# Patient Record
Sex: Male | Born: 1965 | Race: Black or African American | Hispanic: No | Marital: Single | Smoking: Former smoker
Health system: Southern US, Community
[De-identification: ages and names within clinical notes are randomized; demographics above are authoritative.]

## PROBLEM LIST (undated history)

## (undated) DIAGNOSIS — I10 Essential (primary) hypertension: Secondary | ICD-10-CM

## (undated) HISTORY — PX: LIPOMA EXCISION: SHX5283

## (undated) HISTORY — PX: APPENDECTOMY: SHX54

## (undated) HISTORY — PX: CATARACT EXTRACTION: SUR2

## (undated) HISTORY — DX: Essential (primary) hypertension: I10

---

## 1999-03-28 ENCOUNTER — Encounter: Payer: Self-pay | Admitting: Emergency Medicine

## 1999-03-28 ENCOUNTER — Emergency Department (HOSPITAL_COMMUNITY): Admission: EM | Admit: 1999-03-28 | Discharge: 1999-03-28 | Payer: Self-pay | Admitting: Emergency Medicine

## 2000-11-28 ENCOUNTER — Emergency Department (HOSPITAL_COMMUNITY): Admission: EM | Admit: 2000-11-28 | Discharge: 2000-11-28 | Payer: Self-pay | Admitting: Internal Medicine

## 2003-03-08 ENCOUNTER — Emergency Department (HOSPITAL_COMMUNITY): Admission: AD | Admit: 2003-03-08 | Discharge: 2003-03-08 | Payer: Self-pay

## 2003-07-06 ENCOUNTER — Emergency Department (HOSPITAL_COMMUNITY): Admission: EM | Admit: 2003-07-06 | Discharge: 2003-07-06 | Payer: Self-pay

## 2004-05-18 ENCOUNTER — Emergency Department (HOSPITAL_COMMUNITY): Admission: EM | Admit: 2004-05-18 | Discharge: 2004-05-19 | Payer: Self-pay | Admitting: Emergency Medicine

## 2004-09-28 ENCOUNTER — Emergency Department (HOSPITAL_COMMUNITY): Admission: EM | Admit: 2004-09-28 | Discharge: 2004-09-28 | Payer: Self-pay | Admitting: Emergency Medicine

## 2007-09-17 ENCOUNTER — Emergency Department (HOSPITAL_COMMUNITY): Admission: EM | Admit: 2007-09-17 | Discharge: 2007-09-17 | Payer: Self-pay | Admitting: Emergency Medicine

## 2007-09-26 ENCOUNTER — Emergency Department (HOSPITAL_COMMUNITY): Admission: EM | Admit: 2007-09-26 | Discharge: 2007-09-26 | Payer: Self-pay | Admitting: Emergency Medicine

## 2007-10-21 ENCOUNTER — Emergency Department (HOSPITAL_COMMUNITY): Admission: EM | Admit: 2007-10-21 | Discharge: 2007-10-21 | Payer: Self-pay | Admitting: Family Medicine

## 2008-04-02 ENCOUNTER — Emergency Department (HOSPITAL_COMMUNITY): Admission: EM | Admit: 2008-04-02 | Discharge: 2008-04-02 | Payer: Self-pay | Admitting: Family Medicine

## 2008-12-21 IMAGING — CR DG CHEST 2V
2 series · 2 of 2 positions shown · non-contrast
Comparison: 05/19/2004

CLINICAL DATA: Chest pain, cough

CHEST - 2 VIEW

[w chest pa]
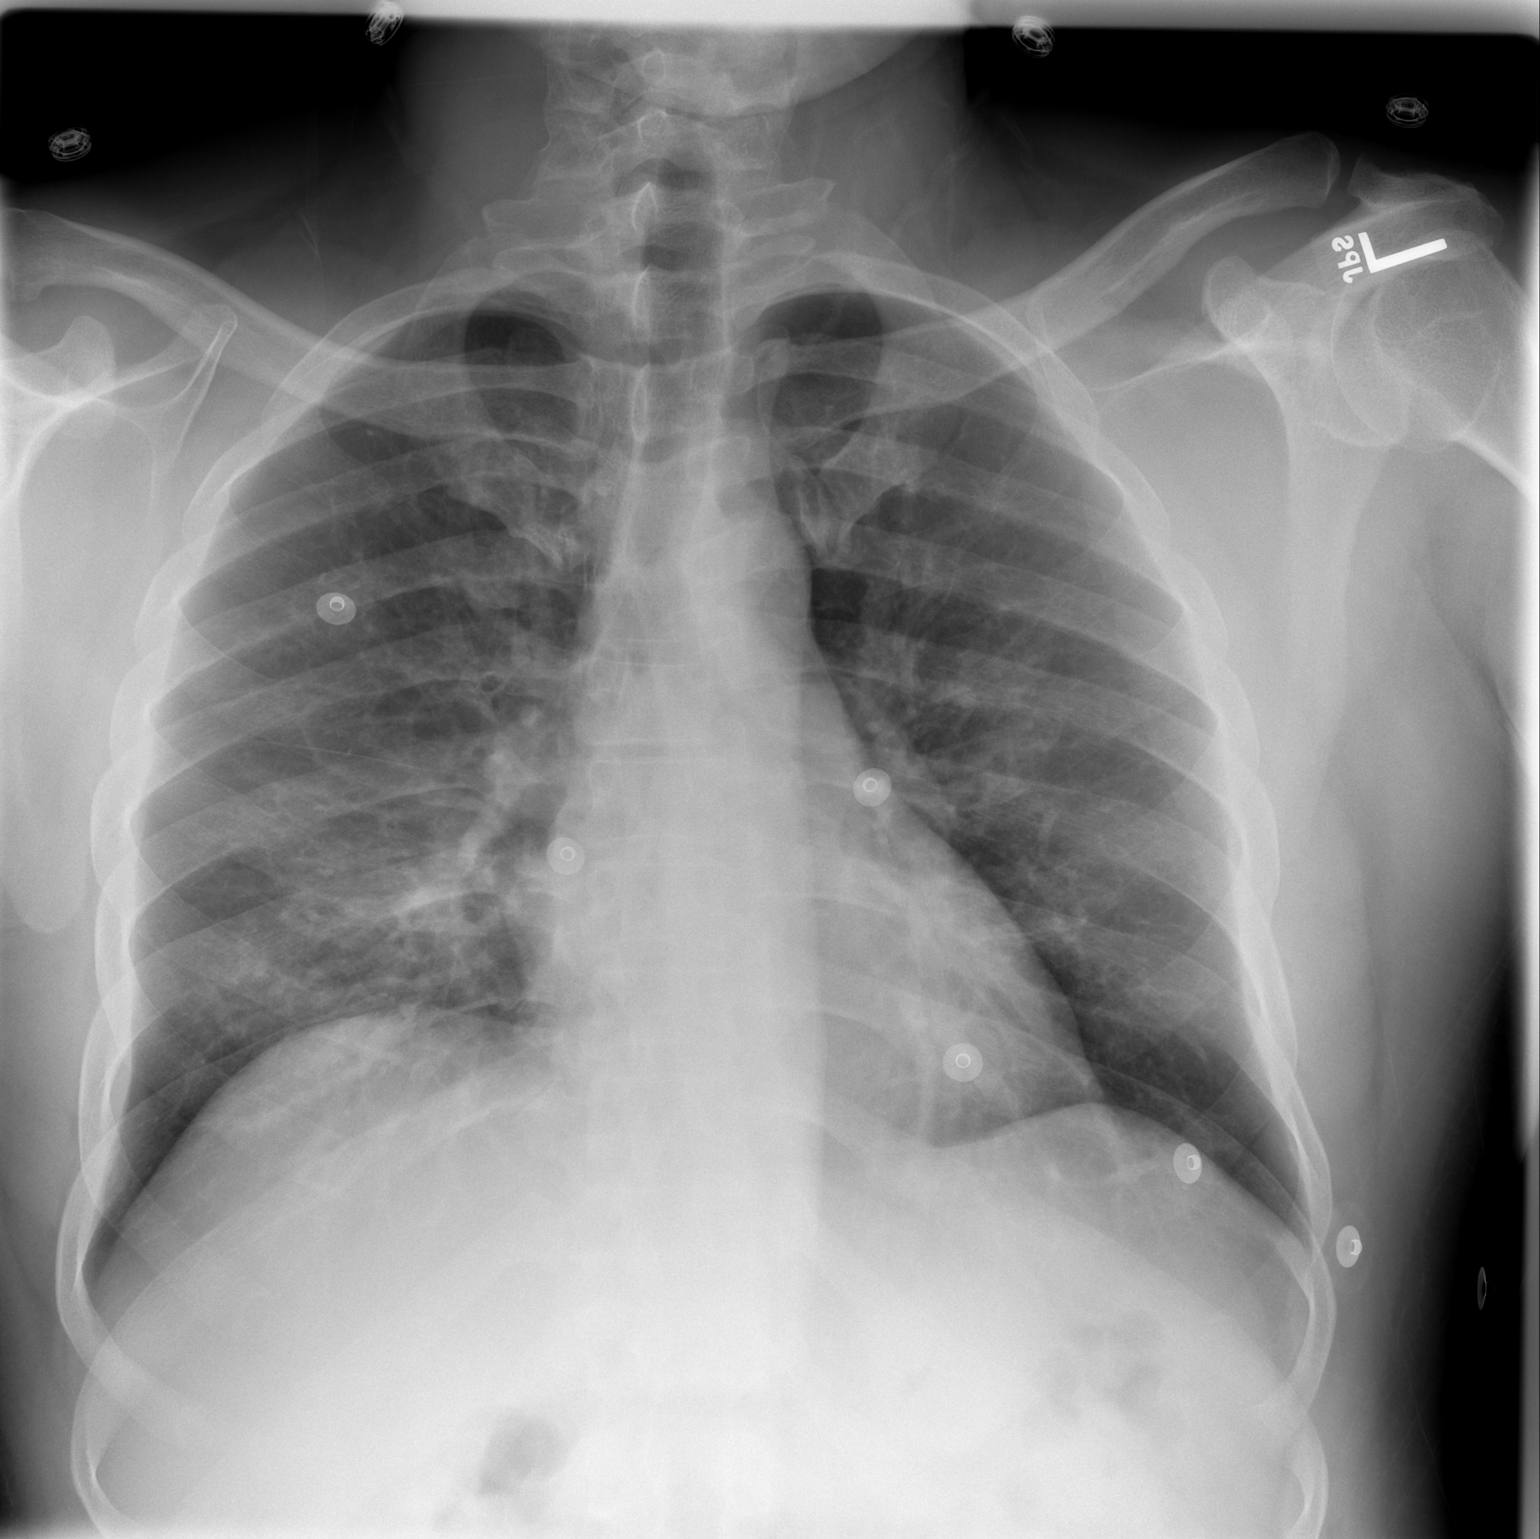

[w chest lat]
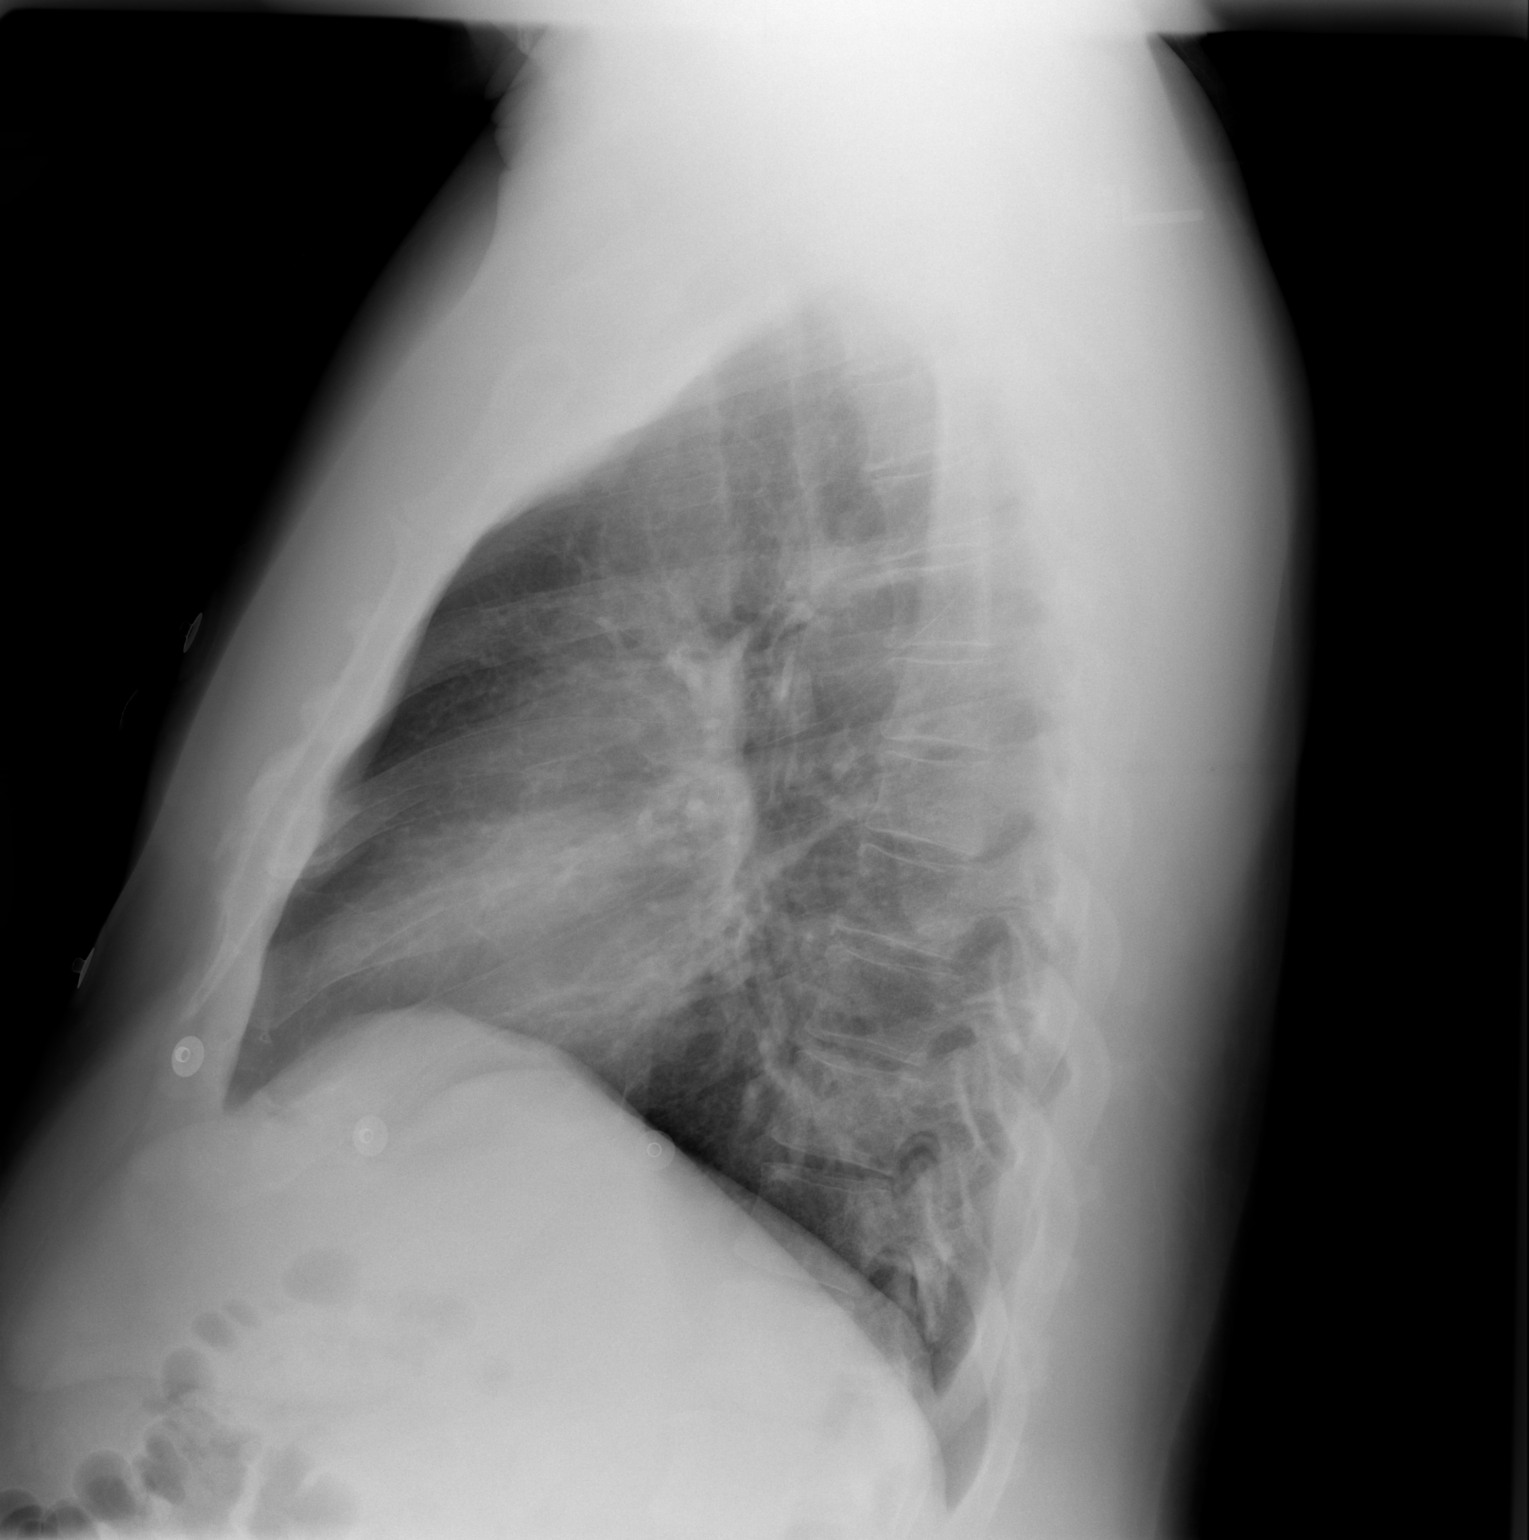

[2 of 2 positions shown; findings below may reference images not displayed]

FINDINGS: The cardiomediastinal silhouette is stable.  There is
patchy haziness in the right base suspicious for atelectasis or
early infiltrate.  There is no pulmonary edema.
IMPRESSION: Patchy atelectasis or early infiltrate in the right base.

## 2010-08-28 ENCOUNTER — Inpatient Hospital Stay (INDEPENDENT_AMBULATORY_CARE_PROVIDER_SITE_OTHER)
Admission: RE | Admit: 2010-08-28 | Discharge: 2010-08-28 | Disposition: A | Discharge status: Home / Self Care | Payer: Self-pay | Source: Ambulatory Visit | Attending: Emergency Medicine | Admitting: Emergency Medicine

## 2010-08-28 DIAGNOSIS — H00019 Hordeolum externum unspecified eye, unspecified eyelid: Secondary | ICD-10-CM

## 2011-03-16 LAB — CBC
Platelets: 194
RDW: 12.4

## 2011-03-16 LAB — DIFFERENTIAL
Basophils Absolute: 0.1
Basophils Relative: 1
Eosinophils Relative: 0
Lymphocytes Relative: 11 — ABNORMAL LOW
Neutro Abs: 8.1 — ABNORMAL HIGH

## 2011-03-16 LAB — POCT CARDIAC MARKERS
CKMB, poc: 1
Myoglobin, poc: 92.4
Operator id: 285491
Troponin i, poc: <0.05

## 2011-03-16 LAB — POCT I-STAT, CHEM 8
BUN: 11
Calcium, Ion: 1.21
Chloride: 105
Creatinine, Ser: 1.4
Glucose, Bld: 101 — ABNORMAL HIGH
HCT: 42
Hemoglobin: 14.3
Potassium: 4
Sodium: 140
TCO2: 23

## 2012-07-31 ENCOUNTER — Emergency Department (HOSPITAL_COMMUNITY)
Admission: EM | Admit: 2012-07-31 | Discharge: 2012-07-31 | Disposition: A | Discharge status: Home / Self Care | Payer: BC Managed Care – PPO | Source: Home / Self Care | Attending: Emergency Medicine | Admitting: Emergency Medicine

## 2012-07-31 ENCOUNTER — Emergency Department (INDEPENDENT_AMBULATORY_CARE_PROVIDER_SITE_OTHER): Payer: BC Managed Care – PPO

## 2012-07-31 ENCOUNTER — Encounter (HOSPITAL_COMMUNITY): Payer: Self-pay | Admitting: Emergency Medicine

## 2012-07-31 DIAGNOSIS — R030 Elevated blood-pressure reading, without diagnosis of hypertension: Secondary | ICD-10-CM

## 2012-07-31 DIAGNOSIS — I10 Essential (primary) hypertension: Secondary | ICD-10-CM

## 2012-07-31 DIAGNOSIS — R0789 Other chest pain: Secondary | ICD-10-CM

## 2012-07-31 MED ORDER — METHOCARBAMOL 500 MG PO TABS: 500.0000 mg | ORAL_TABLET | Freq: Three times a day (TID) | ORAL | Status: DC

## 2012-07-31 MED ORDER — TRAMADOL HCL 50 MG PO TABS: 100.0000 mg | ORAL_TABLET | Freq: Three times a day (TID) | ORAL | Status: DC | PRN

## 2012-07-31 NOTE — ED Notes (Signed)
Pt c/o right sided chest pain x 4 days. Present today with increase in blood pressure. No hx of hypertension. Pt denies sob and injury.

## 2012-07-31 NOTE — ED Notes (Signed)
Right sided chest pain for 1 week, worse w certain movement; NAD

## 2012-07-31 NOTE — ED Provider Notes (Signed)
Chief Complaint  Patient presents with  . Chest Pain    right sided chest pain x 4 days. increase bp no hx of hypertension    History of Present Illness:   Jerry Kaiser is a 47 year old male who has had a 4 to five-day history of a right lateral chest pain. 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 The pain is worse with twisting and bending. It is not worse with deep inspiration, coughing, or sneezing. He denies any exacerbation of pain with the neck of the shoulder. He's had no shortness of breath, coughing, wheezing, fever, or chills. He denies any hemoptysis or sputum production. He's had no anterior chest pain, palpitations, dizziness, syncope, or ankle edema. The pain does extend down into his abdomen somewhat to the right upper quadrant. He's had no other GI symptoms such as nausea, vomiting, diarrhea, blood in the stool. He denies any urinary symptoms.  Review of Systems:  Other than noted above, the patient denies any of the following symptoms. Systemic:  No fever, chills, sweats, or fatigue. ENT:  No nasal congestion, rhinorrhea, or sore throat. Pulmonary:  No cough, wheezing, shortness of breath, sputum production, hemoptysis. Cardiac:  No palpitations, rapid heartbeat, dizziness, presyncope or syncope. GI:  No abdominal pain, heartburn, nausea, or vomiting. Ext:  No leg pain or swelling.  PMFSH:  Past medical history, family history, social history, meds, and allergies were reviewed and updated as needed.   Physical Exam:   Vital signs:  BP 162/98  Pulse 63  Temp(Src) 98.5 F (36.9 C) (Oral)  Resp 22  SpO2 97% Gen:  Alert, oriented, in no distress, skin warm and dry. Eye:  PERRL, lids and conjunctivas normal.  Sclera non-icteric. ENT:  Mucous membranes moist, pharynx clear. Neck:  Supple, no adenopathy or tenderness.  No JVD. Lungs:  Clear to auscultation, no wheezes, rales or rhonchi.  No respiratory  distress. Heart:  Regular rhythm.  No gallops, murmers, clicks or rubs. Chest:  He has localized chest wall tenderness to palpation over the right, lateral, mid chest, no swelling, bruising, or deformity. Abdomen:  Soft, there was slight tenderness to palpation in the right upper quadrant, right flank, and right lower quadrant but no guarding or rebound.  Bowel sounds normal.  No pulsatile abdominal mass or bruit. Ext:  No edema.  No calf tenderness and Homann's sign negative.  Pulses full and equal. Skin:  Warm and dry.  No rash.   Radiology:  Dg Chest 2 View  07/31/2012  *RADIOLOGY REPORT*  Clinical Data: Chest pain, 5 days duration  CHEST - 2 VIEW  Comparison: 09/26/2007  Findings: And heart size is normal.  Mediastinal shadows are normal.  Lungs are clear.  The vascularity is normal.  No effusions.  No bony abnormalities.  IMPRESSION: Normal chest   Original Report Authenticated By: Paulina Fusi, M.D.    I reviewed the images independently and personally and concur with the radiologist's findings.  Assessment:  The primary encounter diagnosis was Musculoskeletal chest pain. A diagnosis of Hypertension was also pertinent to this visit.  Have suggested he avoid heavy lifting for a few days and take the medications as listed below. His blood pressure was elevated today and I recommended a followup with Dr. Alonza Smoker is a primary care physician to recheck his blood pressure. He has not had any known prior history of high blood pressure.  Plan:   1.  The following meds were prescribed:   Discharge Medication List as of 07/31/2012  7:22 PM    START taking these medications   Details  methocarbamol (ROBAXIN) 500 MG tablet Take 1 tablet (500 mg total) by mouth 3 (three) times daily., Starting 07/31/2012, Until Discontinued, Normal    traMADol (ULTRAM) 50 MG tablet Take 2 tablets (100 mg total) by mouth every 8 (eight) hours as needed for pain., Starting 07/31/2012, Until Discontinued, Normal        2.  The patient was instructed in symptomatic care and handouts were given. 3.  The patient was told to return if becoming worse in any way, if no better in 3 or 4 days, and given some red flag symptoms that would indicate earlier return.    Reuben Likes, MD 07/31/12 2105

## 2012-08-04 ENCOUNTER — Encounter (HOSPITAL_COMMUNITY): Payer: Self-pay | Admitting: *Deleted

## 2012-08-04 ENCOUNTER — Emergency Department (INDEPENDENT_AMBULATORY_CARE_PROVIDER_SITE_OTHER)
Admission: EM | Admit: 2012-08-04 | Discharge: 2012-08-04 | Disposition: A | Discharge status: Home / Self Care | Payer: Self-pay | Source: Home / Self Care | Attending: Family Medicine | Admitting: Family Medicine

## 2012-08-04 DIAGNOSIS — R0789 Other chest pain: Secondary | ICD-10-CM

## 2012-08-04 DIAGNOSIS — R030 Elevated blood-pressure reading, without diagnosis of hypertension: Secondary | ICD-10-CM

## 2012-08-04 MED ORDER — PREDNISONE 10 MG PO TABS: 50.0000 mg | ORAL_TABLET | Freq: Every day | ORAL | Status: DC

## 2012-08-04 MED ORDER — TRAMADOL HCL 50 MG PO TABS: 100.0000 mg | ORAL_TABLET | Freq: Three times a day (TID) | ORAL | Status: DC | PRN

## 2012-08-04 NOTE — ED Provider Notes (Signed)
History     CSN: [id number]  Arrival date & time 08/04/12  1738   First MD Initiated Contact with Patient 08/04/12 1747      Chief Complaint  Patient presents with  . Hypertension    (Consider location/radiation/quality/duration/timing/severity/associated sxs/prior treatment) HPI Comments: Pt here on 2/10 with dx msk chest pain, rx tramadol and robaxin. Pt feels they are not helping enough. Also told bp on 2/10 was high, and to check it at home. Pt has been checking last few days and bp has been high. Is worried about how high it is.   Patient is a 47 y.o. male presenting with hypertension. The history is provided by the patient.  Hypertension This is a new problem. The current episode started more than 2 days ago. The problem occurs daily. The problem has not changed since onset.Pertinent negatives include no chest pain, no abdominal pain, no headaches and no shortness of breath. Nothing aggravates the symptoms. Nothing relieves the symptoms. He has tried nothing for the symptoms.    History reviewed. No pertinent past medical history.  Past Surgical History  Procedure Laterality Date  . Eye surgery      Family History  Problem Relation Age of Onset  . Heart failure Mother     History  Substance Use Topics  . Smoking status: Never Smoker   . Smokeless tobacco: Not on file  . Alcohol Use: No      Review of Systems  Constitutional: Negative for fever and chills.  Respiratory: Negative for shortness of breath.   Cardiovascular: Negative for chest pain, palpitations and leg swelling.  Gastrointestinal: Negative for abdominal pain.  Musculoskeletal:       R chest pain  Skin: Negative for rash.  Neurological: Negative for dizziness, weakness and headaches.  Psychiatric/Behavioral: Negative for confusion.    Allergies  Review of patient's allergies indicates no known allergies.  Home Medications   Current Outpatient Rx  Name  Route  Sig  Dispense  Refill  .  methocarbamol (ROBAXIN) 500 MG tablet   Oral   Take 1 tablet (500 mg total) by mouth 3 (three) times daily.   30 tablet   0   . traMADol (ULTRAM) 50 MG tablet   Oral   Take 2 tablets (100 mg total) by mouth every 8 (eight) hours as needed for pain.   30 tablet   0   . predniSONE (DELTASONE) 10 MG tablet   Oral   Take 5 tablets (50 mg total) by mouth daily.   25 tablet   0   . traMADol (ULTRAM) 50 MG tablet   Oral   Take 2 tablets (100 mg total) by mouth every 8 (eight) hours as needed for pain.   30 tablet   0     BP 136/92  Pulse 74  Temp(Src) 98.5 F (36.9 C) (Oral)  Physical Exam  Constitutional: He is oriented to person, place, and time. He appears well-developed and well-nourished. No distress.  Cardiovascular: Normal rate and regular rhythm.   Pulmonary/Chest: Effort normal and breath sounds normal. He exhibits tenderness. He exhibits no mass, no bony tenderness, no edema and no deformity.    Neurological: He is alert and oriented to person, place, and time. Coordination and gait normal.  Skin: Skin is warm and intact. No rash noted.    ED Course  Procedures (including critical care time)  Labs Reviewed - No data to display No results found.   1. Elevated blood pressure reading without  diagnosis of hypertension   2. Musculoskeletal chest pain       MDM  Had lengthy discussion with pt and s.o about htn, how to diagnose and monitor. Pt feels better about high bp measurements, will be sure to measure properly and record. Pt to find pcp and take bp measures over next 3-4 weeks to pcp.  It is likely he will need anti-htn meds.  Discussed diet and lifestyle changes.  Discussed mgmt of chest msk pain. Pt reassured.         Cathlyn Parsons, NP 08/04/12 2053

## 2012-08-04 NOTE — ED Notes (Signed)
States BP was 169/121 at [facility] yesterday.  BP 169/115 today at the [facility].   No headache, blurred vision, or dizziness.  Here 2 days ago for chest pain and had a CXR and got muscle relaxers and pain medicine without relief.

## 2012-08-07 NOTE — ED Provider Notes (Signed)
Medical screening examination/treatment/procedure(s) were performed by resident physician or non-physician practitioner and as supervising physician I was immediately available for consultation/collaboration.   Jerral Mccauley DOUGLAS MD.   Veroncia Jezek D Yadier Bramhall, MD 08/07/12 1645 

## 2012-09-22 ENCOUNTER — Encounter (HOSPITAL_COMMUNITY): Payer: Self-pay | Admitting: *Deleted

## 2012-09-22 ENCOUNTER — Emergency Department (HOSPITAL_COMMUNITY)
Admission: EM | Admit: 2012-09-22 | Discharge: 2012-09-22 | Disposition: A | Discharge status: Home / Self Care | Payer: BC Managed Care – PPO | Source: Home / Self Care | Attending: Emergency Medicine | Admitting: Emergency Medicine

## 2012-09-22 DIAGNOSIS — J029 Acute pharyngitis, unspecified: Secondary | ICD-10-CM

## 2012-09-22 MED ORDER — DEXAMETHASONE SODIUM PHOSPHATE 10 MG/ML IJ SOLN
10.0000 mg | Freq: Once | INTRAMUSCULAR | Status: AC
  Administered 2012-09-22: 10 mg via INTRAMUSCULAR

## 2012-09-22 MED ORDER — DEXAMETHASONE SODIUM PHOSPHATE 10 MG/ML IJ SOLN
INTRAMUSCULAR | Status: AC
  Filled 2012-09-22: qty 1

## 2012-09-22 MED ORDER — DEXAMETHASONE SODIUM PHOSPHATE 10 MG/ML IJ SOLN: 10.0000 mg | Freq: Once | INTRAMUSCULAR | Status: DC

## 2012-09-22 MED ORDER — PENICILLIN G BENZATHINE 1200000 UNIT/2ML IM SUSP
1.2000 10*6.[IU] | Freq: Once | INTRAMUSCULAR | Status: AC
  Administered 2012-09-22: 1.2 10*6.[IU] via INTRAMUSCULAR

## 2012-09-22 MED ORDER — PENICILLIN G BENZATHINE 1200000 UNIT/2ML IM SUSP
INTRAMUSCULAR | Status: AC
  Filled 2012-09-22: qty 2

## 2012-09-22 NOTE — ED Provider Notes (Signed)
History     CSN: [id number]  Arrival date & time 09/22/12  1009   First MD Initiated Contact with Patient 09/22/12 1034      Chief Complaint  Patient presents with  . Sore Throat    HPI: Patient is a 47 y.o. male presenting with pharyngitis. The history is provided by the patient.  Sore Throat This is a new problem. The current episode started yesterday. The problem occurs constantly. The problem has been gradually worsening. Associated symptoms include headaches. Pertinent negatives include no abdominal pain. The symptoms are aggravated by swallowing. Nothing relieves the symptoms. He has tried nothing for the symptoms.  Pt reports onset of sore throat yesterday that has been associated with frontal area h/a and pain over his eyes. He has had chills but has not taken his temerature. Pt also reports body aches. Denies ear pain or cough. Denies N/V/D or abd pain.   History reviewed. No pertinent past medical history.  Past Surgical History  Procedure Laterality Date  . Eye surgery      Family History  Problem Relation Age of Onset  . Heart failure Mother     History  Substance Use Topics  . Smoking status: Never Smoker   . Smokeless tobacco: Not on file  . Alcohol Use: No      Review of Systems  Constitutional: Positive for chills.  HENT: Positive for sore throat, trouble swallowing and voice change. Negative for congestion, rhinorrhea, sneezing, dental problem, postnasal drip and sinus pressure.   Eyes: Negative for pain and redness.  Respiratory: Negative.   Cardiovascular: Negative.   Gastrointestinal: Negative.  Negative for nausea, vomiting, abdominal pain and diarrhea.  Endocrine: Negative.   Genitourinary: Negative.   Musculoskeletal: Positive for myalgias.  Skin: Negative for rash.  Allergic/Immunologic: Negative.   Neurological: Positive for headaches.  Hematological: Negative.   Psychiatric/Behavioral: Negative.   All other systems reviewed and are  negative.    Allergies  Review of patient's allergies indicates no known allergies.  Home Medications  No current outpatient prescriptions on file.  BP 156/98  Pulse 98  Temp(Src) 99.5 F (37.5 C) (Oral)  Resp 22  SpO2 99%  Physical Exam  Constitutional: He appears well-developed and well-nourished.  HENT:  Right Ear: Tympanic membrane, external ear and ear canal normal.  Left Ear: Tympanic membrane, external ear and ear canal normal.  Nose: Nose normal. Right sinus exhibits no maxillary sinus tenderness and no frontal sinus tenderness. Left sinus exhibits no maxillary sinus tenderness and no frontal sinus tenderness.  Mouth/Throat: Uvula is midline and mucous membranes are normal. Posterior oropharyngeal erythema present.  Tonsils very erythematous and swollen (R>L) bil. Small amount thick white exudate.  Pt mildly hoarse. No difficulty swallowing. No drooling.  Eyes: Conjunctivae are normal.  Cardiovascular: Normal rate and regular rhythm.   Pulmonary/Chest: Effort normal and breath sounds normal.  Musculoskeletal: Normal range of motion.  Lymphadenopathy:    He has cervical adenopathy.  Skin: Skin is warm and dry.  Psychiatric: He has a normal mood and affect.    ED Course  Procedures   Labs Reviewed  POCT RAPID STREP A ([facility] ONLY) - Abnormal; Notable for the following:    Streptococcus, Group A Screen (Direct) POSITIVE (*)    All other components within normal limits   No results found.   No diagnosis found.    MDM  Sore throat since yesterday. ? Fever, + chills. No other associated URI sx's. Acute tonsillar swelling (bil),  small amount white exudate and erythema. Treated with  Bicillin LA 1.2 mil IM and Decadron 10mg  IM. At home palliative measures discussed.         Leanne Chang, NP 09/22/12 [phone]

## 2012-09-22 NOTE — ED Notes (Signed)
Pt  Reports  Symptoms  Of  sorethroat   Body  Aches      And  Pain   Above  Eyes       Since  Yesterday         Pt  Reports  Legs  Are  painfull  And  Feel  Weak

## 2012-09-22 NOTE — ED Provider Notes (Signed)
Medical screening examination/treatment/procedure(s) were performed by non-physician practitioner and as supervising physician I was immediately available for consultation/collaboration.  Raynald Blend, MD 09/22/12 1350

## 2012-12-10 ENCOUNTER — Encounter (HOSPITAL_COMMUNITY): Payer: Self-pay | Admitting: Adult Health

## 2012-12-10 DIAGNOSIS — Z9889 Other specified postprocedural states: Secondary | ICD-10-CM | POA: Insufficient documentation

## 2012-12-10 DIAGNOSIS — R51 Headache: Secondary | ICD-10-CM | POA: Insufficient documentation

## 2012-12-10 DIAGNOSIS — Z8669 Personal history of other diseases of the nervous system and sense organs: Secondary | ICD-10-CM | POA: Insufficient documentation

## 2012-12-10 MED ORDER — OXYCODONE-ACETAMINOPHEN 5-325 MG PO TABS
1.0000 | ORAL_TABLET | Freq: Once | ORAL | Status: AC
  Administered 2012-12-10: 1 via ORAL
  Filled 2012-12-10: qty 1

## 2012-12-10 NOTE — ED Notes (Signed)
Presents with headache described as throbbing that began 2 days ago. 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 . Denies sensitivity to sound and light. Denies nausea. Pt is alert and oriented. MAEx4.  Pain is located on left side of head and in jaw. He is unsure if a bad tooth is causing the headache.  Denies any blurry vision or vision changes.

## 2012-12-11 ENCOUNTER — Emergency Department (HOSPITAL_COMMUNITY)
Admission: EM | Admit: 2012-12-11 | Discharge: 2012-12-11 | Disposition: A | Discharge status: Home / Self Care | Payer: BC Managed Care – PPO | Attending: Emergency Medicine | Admitting: Emergency Medicine

## 2012-12-11 ENCOUNTER — Encounter (HOSPITAL_COMMUNITY): Payer: Self-pay | Admitting: Radiology

## 2012-12-11 ENCOUNTER — Emergency Department (HOSPITAL_COMMUNITY): Payer: BC Managed Care – PPO

## 2012-12-11 DIAGNOSIS — R519 Headache, unspecified: Secondary | ICD-10-CM

## 2012-12-11 MED ORDER — KETOROLAC TROMETHAMINE 30 MG/ML IJ SOLN
30.0000 mg | Freq: Once | INTRAMUSCULAR | Status: AC
  Administered 2012-12-11: 30 mg via INTRAVENOUS
  Filled 2012-12-11: qty 1

## 2012-12-11 MED ORDER — DEXAMETHASONE SODIUM PHOSPHATE 10 MG/ML IJ SOLN
10.0000 mg | Freq: Once | INTRAMUSCULAR | Status: AC
  Administered 2012-12-11: 10 mg via INTRAVENOUS
  Filled 2012-12-11: qty 1

## 2012-12-11 MED ORDER — METOCLOPRAMIDE HCL 5 MG/ML IJ SOLN
10.0000 mg | Freq: Once | INTRAMUSCULAR | Status: AC
  Administered 2012-12-11: 10 mg via INTRAVENOUS
  Filled 2012-12-11: qty 2

## 2012-12-11 MED ORDER — DIPHENHYDRAMINE HCL 50 MG/ML IJ SOLN
25.0000 mg | Freq: Once | INTRAMUSCULAR | Status: AC
  Administered 2012-12-11: 25 mg via INTRAVENOUS
  Filled 2012-12-11: qty 1

## 2012-12-11 NOTE — ED Provider Notes (Signed)
History     CSN: [id number]  Arrival date & time 12/10/12  2056   First MD Initiated Contact with Patient 12/11/12 0023      Chief Complaint  Patient presents with  . Headache   HPI  History provided by the patient. Patient is a 47 year old male with previous history of left eye surgeries and blindness who presents with complaints of left sided headache. Patient has had a throbbing headache that was waxing and waning for the past 2 days. Patient was using ibuprofen at home did have some relief of symptoms but his headache has been nagging and persistent. He denies any other aggravating or alleviating factors. He denies having similar headaches previously. Pain radiates some to the left side of the face and ear. He denies any dental pain. No recent nasal congestion or sinus pressure. Denies any other associated symptoms. No fever, chills or sweats.     History reviewed. No pertinent past medical history.  Past Surgical History  Procedure Laterality Date  . Eye surgery      Family History  Problem Relation Age of Onset  . Heart failure Mother     History  Substance Use Topics  . Smoking status: Never Smoker   . Smokeless tobacco: Not on file  . Alcohol Use: No      Review of Systems  Constitutional: Negative for fever, chills and diaphoresis.  Eyes: Negative for photophobia.  Gastrointestinal: Negative for nausea and vomiting.  Neurological: Positive for headaches. Negative for dizziness and light-headedness.  All other systems reviewed and are negative.    Allergies  Review of patient's allergies indicates no known allergies.  Home Medications   Current Outpatient Rx  Name  Route  Sig  Dispense  Refill  . ibuprofen (ADVIL,MOTRIN) 200 MG tablet   Oral   Take 600 mg by mouth 3 (three) times daily as needed for pain.           BP 151/103  Pulse 54  Temp(Src) 98.1 F (36.7 C) (Oral)  Resp 20  SpO2 100%  Physical Exam  Nursing note and vitals  reviewed. Constitutional: He is oriented to person, place, and time. He appears well-developed and well-nourished. No distress.  HENT:  Head: Normocephalic and atraumatic.  No sinus pressure or nasal congestion  Eyes: Conjunctivae and EOM are normal. Pupils are equal, round, and reactive to light.  Neck: Normal range of motion. Neck supple.  No meningeal sign  Cardiovascular: Normal rate and regular rhythm.   Pulmonary/Chest: Effort normal and breath sounds normal. No respiratory distress. He has no wheezes.  Abdominal: Soft. There is no tenderness.  Musculoskeletal: Normal range of motion. He exhibits no edema.  Neurological: He is alert and oriented to person, place, and time. He has normal strength. No cranial nerve deficit or sensory deficit. Coordination and gait normal.  Skin: Skin is warm. No rash noted.  Psychiatric: He has a normal mood and affect. His behavior is normal.    ED Course  Procedures   Ct Head Wo Contrast  12/11/2012   *RADIOLOGY REPORT*  Clinical Data:  Headache  CT HEAD WITHOUT CONTRAST  Technique:  Contiguous axial images were obtained from the base of the skull through the vertex without contrast  Comparison:  None.  Findings:  The brain has a normal appearance without evidence for hemorrhage, acute infarction, hydrocephalus, or mass lesion.  There is no extra axial fluid collection.  The skull and paranasal sinuses are normal.  IMPRESSION: Normal CT of  the head without contrast.   Original Report Authenticated By: Janeece Riggers, M.D.     1. Headache       MDM  Patient seen and evaluated. Patient well-appearing in no acute distress.  CT unremarkable. Headache symptoms improve. We'll discharge at this time.        Angus Seller, PA-C 12/11/12 [phone]

## 2012-12-18 NOTE — ED Provider Notes (Signed)
Medical screening examination/treatment/procedure(s) were performed by non-physician practitioner and as supervising physician I was immediately available for consultation/collaboration.  Toy Baker, MD 12/18/12 [phone]

## 2014-11-29 ENCOUNTER — Encounter (HOSPITAL_COMMUNITY): Payer: Self-pay | Admitting: Emergency Medicine

## 2014-11-29 ENCOUNTER — Emergency Department (HOSPITAL_COMMUNITY)
Admission: EM | Admit: 2014-11-29 | Discharge: 2014-11-29 | Disposition: A | Discharge status: Home / Self Care | Payer: No Typology Code available for payment source | Attending: Emergency Medicine | Admitting: Emergency Medicine

## 2014-11-29 DIAGNOSIS — M545 Low back pain: Secondary | ICD-10-CM | POA: Diagnosis present

## 2014-11-29 DIAGNOSIS — R260 Ataxic gait: Secondary | ICD-10-CM | POA: Insufficient documentation

## 2014-11-29 DIAGNOSIS — M5441 Lumbago with sciatica, right side: Secondary | ICD-10-CM | POA: Insufficient documentation

## 2014-11-29 MED ORDER — DEXAMETHASONE SODIUM PHOSPHATE 10 MG/ML IJ SOLN
6.0000 mg | Freq: Once | INTRAMUSCULAR | Status: AC
  Administered 2014-11-29: 6 mg via INTRAMUSCULAR
  Filled 2014-11-29: qty 1

## 2014-11-29 MED ORDER — PREDNISONE 20 MG PO TABS: 40.0000 mg | ORAL_TABLET | Freq: Every day | ORAL | Status: DC

## 2014-11-29 MED ORDER — KETOROLAC TROMETHAMINE 60 MG/2ML IM SOLN
60.0000 mg | Freq: Once | INTRAMUSCULAR | Status: AC
  Administered 2014-11-29: 60 mg via INTRAMUSCULAR
  Filled 2014-11-29: qty 2

## 2014-11-29 MED ORDER — NAPROXEN 500 MG PO TABS: 500.0000 mg | ORAL_TABLET | Freq: Two times a day (BID) | ORAL | Status: DC

## 2014-11-29 MED ORDER — METHOCARBAMOL 500 MG PO TABS: 500.0000 mg | ORAL_TABLET | Freq: Two times a day (BID) | ORAL | Status: DC | PRN

## 2014-11-29 NOTE — ED Provider Notes (Signed)
CSN: [id number]     Arrival date & time 11/29/14  1040 History   First MD Initiated Contact with Patient 11/29/14 1056     Chief Complaint  Patient presents with  . Back Pain     (Consider location/radiation/quality/duration/timing/severity/associated sxs/prior Treatment) HPI   Mr. Pettet is a 49 year old male who presents to the ER today for gradual onset of right-sided low back pain with radiation down right leg.  He denies any trauma or incident which strained his back, but his job requires frequent lifting and squatting. Back pain and leg pain has gradually increased in severity over the last couple days, and he decided to come to the ER once he had difficulty walking, sitting, lying. The pain is so severe he was unable to sleep last night. He states he was able to prop himself up on pillow and get some relief. Pain is located in his low right back and right buttocks, with radiation down his lateral right thigh, which feels like electricity.  It is worse with movement or with palpation to the area. He denies any weakness, numbness, tingling, bladder or bowel incontinence.  Patient denies any past medical history, immunocompromise state, or injected drug use.  History reviewed. No pertinent past medical history. Past Surgical History  Procedure Laterality Date  . Eye surgery     Family History  Problem Relation Age of Onset  . Heart failure Mother    History  Substance Use Topics  . Smoking status: Never Smoker   . Smokeless tobacco: Not on file  . Alcohol Use: No    Review of Systems  All other systems reviewed and are negative.    Allergies  Review of patient's allergies indicates no known allergies.  Home Medications   Prior to Admission medications   Medication Sig Start Date End Date Taking? Authorizing Provider  ibuprofen (ADVIL,MOTRIN) 200 MG tablet Take 600 mg by mouth 3 (three) times daily as needed for pain.    Historical Provider, MD   BP 134/92 mmHg  Pulse  73  Temp(Src) 97.6 F (36.4 C) (Oral)  Resp 16  SpO2 99% Physical Exam  Constitutional: He is oriented to person, place, and time. He appears well-developed and well-nourished. No distress.  HENT:  Head: Normocephalic and atraumatic.  Cardiovascular: Normal rate and regular rhythm.   Pulmonary/Chest: Effort normal and breath sounds normal. No respiratory distress.  Musculoskeletal: He exhibits tenderness. He exhibits no edema.  No spinal tenderness, right lumbar paraspinal tender to palpation, ttp in right buttox and over greater trochanter of femur. Normal ROM for right hip flexion, extension, abduction, adduction, knee flexion, extension   Neurological: He is alert and oriented to person, place, and time. He has normal strength. No cranial nerve deficit or sensory deficit. Gait abnormal.  Antalgic gait  Skin: Skin is warm and dry. No rash noted. He is not diaphoretic. No erythema. No pallor.    ED Course  Procedures (including critical care time) Labs Review Labs Reviewed - No data to display  Imaging Review No results found.   EKG Interpretation None      MDM   Final diagnoses:  None   Patient with low right back pain with radiation to RLE.  No neurological deficits and normal neuro exam.  Patient can walk but states is painful.  No loss of bowel or bladder control.  No concern for cauda equina.  No fever, night sweats, weight loss, h/o cancer, IVDU.   Pt able to ambulate in ED  with minimal difficulty from pain.  Advised heat and cold therapy, NSAID use, muscle relaxer for muscle spasms and prednisone taper for inflammation.  Pt to f-u with PCP or ortho if symptoms persist. Pt stable for d/c home.    Delsa Grana, PA-C 11/29/14 1634  Elnora Morrison, MD 11/29/14 [phone]

## 2014-11-29 NOTE — Discharge Instructions (Signed)
Abdominal Pain Many things can cause belly (abdominal) pain. Most times, the belly pain is not dangerous. Many cases of belly pain can be watched and treated at home. HOME CARE   Do not take medicines that help you go poop (laxatives) unless told to by your doctor.  Only take medicine as told by your doctor.  Eat or drink as told by your doctor. Your doctor will tell you if you should be on a special diet. GET HELP IF:  You do not know what is causing your belly pain.  You have belly pain while you are sick to your stomach (nauseous) or have runny poop (diarrhea).  You have pain while you pee or poop.  Your belly pain wakes you up at night.  You have belly pain that gets worse or better when you eat.  You have belly pain that gets worse when you eat fatty foods.  You have a fever. GET HELP RIGHT AWAY IF:   The pain does not go away within 2 hours.  You keep throwing up (vomiting).  The pain changes and is only in the right or left part of the belly.  You have bloody or tarry looking poop. MAKE SURE YOU:   Understand these instructions.  Will watch your condition.  Will get help right away if you are not doing well or get worse. Document Released: 11/24/2007 Document Revised: 06/12/2013 Document Reviewed: 02/14/2013 [facility] Patient Information 2015 [place], [place]. This information is not intended to replace advice given to you by your health care provider. Make sure you discuss any questions you have with your health care provider.  Sciatica Sciatica is pain, weakness, numbness, or tingling along the path of the sciatic nerve. The nerve starts in the lower back and runs down the back of each leg. The nerve controls the muscles in the lower leg and in the back of the knee, while also providing sensation to the back of the thigh, lower leg, and the sole of your foot. Sciatica is a symptom of another medical condition. For instance, nerve damage or certain conditions, such  as a herniated disk or bone spur on the spine, pinch or put pressure on the sciatic nerve. This causes the pain, weakness, or other sensations normally associated with sciatica. Generally, sciatica only affects one side of the body. CAUSES   Herniated or slipped disc.  Degenerative disk disease.  A pain disorder involving the narrow muscle in the buttocks (piriformis syndrome).  Pelvic injury or fracture.  Pregnancy.  Tumor (rare). SYMPTOMS  Symptoms can vary from mild to very severe. The symptoms usually travel from the low back to the buttocks and down the back of the leg. Symptoms can include:  Mild tingling or dull aches in the lower back, leg, or hip.  Numbness in the back of the calf or sole of the foot.  Burning sensations in the lower back, leg, or hip.  Sharp pains in the lower back, leg, or hip.  Leg weakness.  Severe back pain inhibiting movement. These symptoms may get worse with coughing, sneezing, laughing, or prolonged sitting or standing. Also, being overweight may worsen symptoms. DIAGNOSIS  Your caregiver will perform a physical exam to look for common symptoms of sciatica. He or she may ask you to do certain movements or activities that would trigger sciatic nerve pain. Other tests may be performed to find the cause of the sciatica. These may include:  Blood tests.  X-rays.  Imaging tests, such as an  MRI or CT scan. TREATMENT  Treatment is directed at the cause of the sciatic pain. Sometimes, treatment is not necessary and the pain and discomfort goes away on its own. If treatment is needed, your caregiver may suggest:  Over-the-counter medicines to relieve pain.  Prescription medicines, such as anti-inflammatory medicine, muscle relaxants, or narcotics.  Applying heat or ice to the painful area.  Steroid injections to lessen pain, irritation, and inflammation around the nerve.  Reducing activity during periods of pain.  Exercising and stretching  to strengthen your abdomen and improve flexibility of your spine. Your caregiver may suggest losing weight if the extra weight makes the back pain worse.  Physical therapy.  Surgery to eliminate what is pressing or pinching the nerve, such as a bone spur or part of a herniated disk. HOME CARE INSTRUCTIONS   Only take over-the-counter or prescription medicines for pain or discomfort as directed by your caregiver.  Apply ice to the affected area for 20 minutes, 3-4 times a day for the first 48-72 hours. Then try heat in the same way.  Exercise, stretch, or perform your usual activities if these do not aggravate your pain.  Attend physical therapy sessions as directed by your caregiver.  Keep all follow-up appointments as directed by your caregiver.  Do not wear high heels or shoes that do not provide proper support.  Check your mattress to see if it is too soft. A firm mattress may lessen your pain and discomfort. SEEK IMMEDIATE MEDICAL CARE IF:   You lose control of your bowel or bladder (incontinence).  You have increasing weakness in the lower back, pelvis, buttocks, or legs.  You have redness or swelling of your back.  You have a burning sensation when you urinate.  You have pain that gets worse when you lie down or awakens you at night.  Your pain is worse than you have experienced in the past.  Your pain is lasting longer than 4 weeks.  You are suddenly losing weight without reason. MAKE SURE YOU:  Understand these instructions.  Will watch your condition.  Will get help right away if you are not doing well or get worse. Document Released: 06/01/2001 Document Revised: 12/07/2011 Document Reviewed: 10/17/2011 [facility] Patient Information 2015 [place], [place]. This information is not intended to replace advice given to you by your health care provider. Make sure you discuss any questions you have with your health care provider.  Bursitis/IT Band  Syndrome/Pain  Bursitis - ETIOLOGY -- Bursitis may result from any one or combination of the following causes:  ?Direct injury or trauma. ?Prolonged pressure. This can occur after prolonged kneeling or leaning on an elbow. ?Overuse or strenuous activity.  Treatment - IT band   Symptom control -- Treatment for patients whose initial presentation occurs within a few days to a week or so after the onset of symptoms consists of the following steps. This treatment continues for one to two weeks.  ?Rest from any activities that reproduce symptoms. ?Apply ice to the symptomatic area (lateral aspect of knee) for 10 to 15 minutes per hour, several times per day. Avoid cold injury by using ice massage (massaging area of ITB with small amount of ice--avoiding constant prolonged contact of ice with skin). ?Use oral anti-inflammatories (NSAIDs) and other oral analgesics (eg, acetaminophen) to control pain  The patient is advised to apply ice or cold packs intermittently as needed to relieve pain.

## 2014-11-29 NOTE — ED Notes (Signed)
Pt. Stated, I just started having right side back pain  And goes down my right leg some. Bad when I bend or walk.

## 2015-05-13 ENCOUNTER — Encounter: Payer: Self-pay | Admitting: Adult Health

## 2015-05-13 ENCOUNTER — Ambulatory Visit (INDEPENDENT_AMBULATORY_CARE_PROVIDER_SITE_OTHER): Payer: No Typology Code available for payment source | Admitting: Adult Health

## 2015-05-13 VITALS — BP 144/118 | Temp 98.0°F | Ht 73.5 in | Wt 258.0 lb

## 2015-05-13 DIAGNOSIS — Z7189 Other specified counseling: Secondary | ICD-10-CM | POA: Diagnosis not present

## 2015-05-13 DIAGNOSIS — D170 Benign lipomatous neoplasm of skin and subcutaneous tissue of head, face and neck: Secondary | ICD-10-CM

## 2015-05-13 DIAGNOSIS — Z23 Encounter for immunization: Secondary | ICD-10-CM | POA: Diagnosis not present

## 2015-05-13 DIAGNOSIS — Z7689 Persons encountering health services in other specified circumstances: Secondary | ICD-10-CM

## 2015-05-13 NOTE — Patient Instructions (Signed)
It was so nice meeting you and I am glad to have you on the team!  Follow up with me in March for your next physical   Someone from Dr. Cristal Generous office will call you to schedule an appointment.  If you need anything, please let me know.

## 2015-05-13 NOTE — Progress Notes (Signed)
Pre visit review using our clinic review tool, if applicable. No additional management support is needed unless otherwise documented below in the visit note. 

## 2015-05-13 NOTE — Progress Notes (Signed)
HPI:  Jerry Kaiser is here to establish care.  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 He endorses that it is getting bigger and would like to have surgery to remove it. Denies any discomfort from it.    ROS negative for unless reported above: fevers, chills,feeling poorly, unintentional weight loss, hearing or vision loss, chest pain, palpitations, leg claudication, struggling to breath,Not feeling congested in the chest, no orthopenia, no cough,no wheezing, normal appetite, no soft tissue swelling, no hemoptysis, melena, hematochezia, hematuria, falls, loc, si, or thoughts of self harm.  Immunizations: UTD Diet:He eats healthy  Exercise: Walks a lot and weight lifting.  Dentist: Twice a year, has partials.  Eye: Needs to make an appointment  No past medical history on file.  Past Surgical History  Procedure Laterality Date  . Eye surgery      Family History  Problem Relation Age of Onset  . Heart failure Mother     Social History   Social History  . Marital Status: Married    Spouse Name: N/A  . Number of Children: N/A  . Years of Education: N/A   Social History Main Topics  . Smoking status: Former Research scientist (life sciences)  . Smokeless tobacco: None  . Alcohol Use: No  . Drug Use: No  . Sexual Activity: Yes    Birth Control/ Protection: None   Other Topics Concern  . None   Social History Narrative     Current outpatient prescriptions:  .  ibuprofen (ADVIL,MOTRIN) 200 MG tablet, Take 600 mg by mouth 3 (three) times daily as needed for pain., Disp: , Rfl:  .  methocarbamol (ROBAXIN) 500 MG tablet, Take 1 tablet (500 mg total) by mouth 2 (two) times daily as needed for muscle spasms. (Patient not taking: Reported on 05/13/2015), Disp: 20 tablet, Rfl: 0  EXAM:  Filed Vitals:   05/13/15 1416  BP:  144/118  Temp: 98 F (36.7 C)    Body mass index is 33.57 kg/(m^2).  GENERAL: vitals reviewed and listed above, alert, oriented, appears well hydrated and in no acute distress. Slightly obese  HEENT: atraumatic, conjunttiva clear, no obvious abnormalities on inspection of external nose and ears. Pupil in left eye non constricting due to old eye surgery.   NECK: Neck is soft and supple without masses, no adenopathy or thyromegaly, trachea midline, no JVD. Normal range of motion.   LUNGS: clear to auscultation bilaterally, no wheezes, rales or rhonchi, good air movement  CV: Regular rate and rhythm, normal S1/S2, no audible murmurs, gallops, or rubs. No carotid bruit and no peripheral edema.   MS: moves all extremities without noticeable abnormality. No edema noted  Abd: soft/nontender/nondistended/normal bowel sounds. Obese around abdomen.   Skin: warm and dry, no rash   Extremities: No clubbing, cyanosis, or edema. Capillary refill is WNL. Pulses intact bilaterally in upper and lower extremities.   Neuro: CN II-XII intact, sensation and reflexes normal throughout, 5/5 muscle strength in bilateral upper and lower extremities. Normal finger to nose. Normal rapid alternating movements. Normal romberg. No pronator drift.   PSYCH: pleasant and cooperative, no obvious depression or anxiety  ASSESSMENT AND PLAN:  1. Encounter to establish care - Blood pressure elevated in the office today. Originally 144/118, he endorses having trouble finding the building and felt  like he was going to be late. Blood pressure after 10 minutes 146/97. He is not eating the healthiest currently. He is going to work on his diet and follow up next week.  - Will consider placing on HCTZ or Lisinopril if BP still elevated.  - Advised to go to the ER with any headaches or blurred vision.  2. Lipoma of head - Ambulatory referral to General Surgery     Discussed the following assessment and plan:  No  diagnosis found. -We reviewed the PMH, PSH, FH, SH, Meds and Allergies. -We provided refills for any medications we will prescribe as needed. -We addressed current concerns per orders and patient instructions. -We have asked for records for pertinent exams, studies, vaccines and notes from previous providers. -We have advised patient to follow up per instructions below.   -Patient advised to return or notify a provider immediately if symptoms worsen or persist or new concerns arise.  There are no Patient Instructions on file for this visit.   [employer]

## 2015-05-20 ENCOUNTER — Encounter: Payer: Self-pay | Admitting: Adult Health

## 2015-05-20 ENCOUNTER — Ambulatory Visit (INDEPENDENT_AMBULATORY_CARE_PROVIDER_SITE_OTHER): Payer: No Typology Code available for payment source | Admitting: Adult Health

## 2015-05-20 VITALS — BP 150/100 | Temp 98.2°F | Ht 73.5 in | Wt 255.7 lb

## 2015-05-20 DIAGNOSIS — I10 Essential (primary) hypertension: Secondary | ICD-10-CM | POA: Diagnosis not present

## 2015-05-20 MED ORDER — LISINOPRIL 10 MG PO TABS: 10.0000 mg | ORAL_TABLET | Freq: Every day | ORAL | Status: DC

## 2015-05-20 NOTE — Progress Notes (Signed)
   Subjective:    Patient ID: Jerry Kaiser, male    DOB: [birth year], 49 y.o.   MRN: [id number]  HPI   49 year old male who presents to the office today for follow up regarding his blood pressure. During his last office visit it was noticed that his blood pressure was elevated. He wanted to follow up in a week due to not eating healthy and had anxiety about finding the practice last week.   BP Readings from Last 3 Encounters:  05/20/15 150/100  05/13/15 144/118  11/29/14 166/95    He denies any dizziness, headaches, or blurred vision.    Review of Systems  Constitutional: Negative.   HENT: Negative.   Eyes: Negative.   Respiratory: Negative.   Cardiovascular: Negative.   Gastrointestinal: Negative.   Genitourinary: Negative.   Skin: Negative.   Neurological: Negative.   All other systems reviewed and are negative.      Objective:   Physical Exam  Constitutional: He is oriented to person, place, and time. He appears well-developed and well-nourished. No distress.  Cardiovascular: Normal rate, regular rhythm, normal heart sounds and intact distal pulses.  Exam reveals no gallop and no friction rub.   No murmur heard. Pulmonary/Chest: Effort normal and breath sounds normal. No respiratory distress. He has no wheezes. He has no rales. He exhibits no tenderness.  Neurological: He is alert and oriented to person, place, and time.  Skin: Skin is warm and dry. No rash noted. He is not diaphoretic. No erythema. No pallor.  Psychiatric: He has a normal mood and affect. His behavior is normal. Judgment and thought content normal.  Nursing note and vitals reviewed.      Assessment & Plan:  1. Essential hypertension - Blood pressure continues to be elevated. Today BP: (!) 150/100 mmHg He endorses that he is not eating a healthy diet.  - lisinopril (PRINIVIL,ZESTRIL) 10 MG tablet; Take 1 tablet (10 mg total) by mouth daily.  Dispense: 30 tablet; Refill: 3 - BMP with eGFR -  Follow up in 2 weeks - Monitor BP and write in log. Bring log to next appointment.

## 2015-05-20 NOTE — Progress Notes (Signed)
Pre visit review using our clinic review tool, if applicable. No additional management support is needed unless otherwise documented below in the visit note. 

## 2015-05-20 NOTE — Patient Instructions (Signed)
It was great seeing you again  I will let you know if there is anything wrong with your blood work.   Take the lisinopril every day at the same time.   Monitor blood pressure at home and bring the log with you to the next appointment.   If you feel dizzy or lightheaded, take half a pill.   Let me know if you have a dry cough.

## 2015-06-04 ENCOUNTER — Encounter: Payer: Self-pay | Admitting: Adult Health

## 2015-06-04 ENCOUNTER — Ambulatory Visit (INDEPENDENT_AMBULATORY_CARE_PROVIDER_SITE_OTHER): Payer: No Typology Code available for payment source | Admitting: Adult Health

## 2015-06-04 VITALS — BP 130/86 | Temp 98.5°F | Ht 73.5 in | Wt 252.0 lb

## 2015-06-04 DIAGNOSIS — I10 Essential (primary) hypertension: Secondary | ICD-10-CM | POA: Diagnosis not present

## 2015-06-04 NOTE — Progress Notes (Signed)
   Subjective:    Patient ID: Jerry Kaiser, male    DOB: [birth year], 49 y.o.   MRN: [id number]  HPI   49 year old male who presents to the office today for follow up regarding his blood pressure. We started him on 10 mg Lisinopril during his last visit. He has been working on diet and exercise. Has cut out fast foods and pork. Reports that " I feel great, I feel so much better after changing my diet."  He has lost 3 pounds since his last visit.   He denies any dizziness, headaches, or blurred vision  Review of Systems  Constitutional: Negative.   HENT: Negative.   Eyes: Negative.   Respiratory: Negative.   Neurological: Negative.   All other systems reviewed and are negative.  No past medical history on file.  Social History   Social History  . Marital Status: Single    Spouse Name: N/A  . Number of Children: N/A  . Years of Education: N/A   Occupational History  . Not on file.   Social History Main Topics  . Smoking status: Former Research scientist (life sciences)  . Smokeless tobacco: Not on file  . Alcohol Use: No  . Drug Use: No  . Sexual Activity: Yes    Birth Control/ Protection: None   Other Topics Concern  . 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 Eye surgery      Family History  Problem Relation Age of Onset  . Heart failure Mother     No Known Allergies  Current Outpatient Prescriptions on File Prior to Visit  Medication Sig Dispense Refill  . lisinopril (PRINIVIL,ZESTRIL) 10 MG tablet Take 1 tablet (10 mg total) by mouth daily. 30 tablet 3  . ibuprofen (ADVIL,MOTRIN) 200 MG tablet Take 600 mg by mouth 3 (three) times daily as needed for pain. Reported on 06/04/2015    . methocarbamol (ROBAXIN) 500 MG tablet Take 1 tablet (500 mg total) by mouth 2 (two) times daily as needed for muscle spasms. (Patient not taking: Reported on 06/04/2015) 20 tablet 0     No current facility-administered medications on file prior to visit.    BP 130/100 mmHg  Temp(Src) 98.5 F (36.9 C) (Oral)  Ht 6' 1.5" (1.867 m)  Wt 252 lb (114.306 kg)  BMI 32.79 kg/m2       Objective:   Physical Exam  Constitutional: He is oriented to person, place, and time. He appears well-developed and well-nourished. No distress.  Cardiovascular: Normal rate, regular rhythm, normal heart sounds and intact distal pulses.  Exam reveals no gallop and no friction rub.   No murmur heard. Pulmonary/Chest: Effort normal and breath sounds normal. No respiratory distress. He has no wheezes. He has no rales. He exhibits no tenderness.  Neurological: He is alert and oriented to person, place, and time.  Skin: Skin is warm and dry. No rash noted. He is not diaphoretic. No erythema. No pallor.  Psychiatric: He has a normal mood and affect. His behavior is normal. Judgment and thought content normal.  Nursing note and vitals reviewed.      Assessment & Plan:  1. Essential hypertension - He is doing great over the last month.  - Continue to diet and exercise - Will keep on 10 mg lisinopril

## 2015-06-04 NOTE — Progress Notes (Signed)
Pre visit review using our clinic review tool, if applicable. No additional management support is needed unless otherwise documented below in the visit note. 

## 2015-06-30 ENCOUNTER — Other Ambulatory Visit: Payer: Self-pay | Admitting: General Surgery

## 2015-08-20 ENCOUNTER — Telehealth: Payer: Self-pay | Admitting: Adult Health

## 2015-08-20 DIAGNOSIS — I10 Essential (primary) hypertension: Secondary | ICD-10-CM

## 2015-08-20 MED ORDER — LISINOPRIL 10 MG PO TABS: 10.0000 mg | ORAL_TABLET | Freq: Every day | ORAL | Status: DC

## 2015-08-20 NOTE — Telephone Encounter (Signed)
Rx sent to pharmacy   

## 2015-08-20 NOTE — Telephone Encounter (Signed)
Pt need a RX refill on lisinopril 10 mg sent to [facility]

## 2015-08-26 ENCOUNTER — Other Ambulatory Visit (INDEPENDENT_AMBULATORY_CARE_PROVIDER_SITE_OTHER): Payer: BLUE CROSS/BLUE SHIELD

## 2015-08-26 DIAGNOSIS — Z0000 Encounter for general adult medical examination without abnormal findings: Secondary | ICD-10-CM | POA: Diagnosis not present

## 2015-08-26 LAB — POC URINALSYSI DIPSTICK (AUTOMATED)
Bilirubin, UA: NEGATIVE
Blood, UA: NEGATIVE
GLUCOSE UA: NEGATIVE
KETONES UA: NEGATIVE
LEUKOCYTES UA: NEGATIVE
Nitrite, UA: NEGATIVE
PROTEIN UA: NEGATIVE
Spec Grav, UA: 1.01
Urobilinogen, UA: 0.2
pH, UA: 5.5

## 2015-08-26 LAB — BASIC METABOLIC PANEL
BUN: 11 mg/dL (ref 6–23)
CALCIUM: 9.7 mg/dL (ref 8.4–10.5)
CO2: 26 meq/L (ref 19–32)
CREATININE: 1.15 mg/dL (ref 0.40–1.50)
Chloride: 103 mEq/L (ref 96–112)
GFR: 86.79 mL/min (ref >60.00)
GLUCOSE: 81 mg/dL (ref 70–99)
Potassium: 3.8 mEq/L (ref 3.5–5.1)
Sodium: 140 mEq/L (ref 135–145)

## 2015-08-26 LAB — CBC WITH DIFFERENTIAL/PLATELET
BASOS ABS: 0 10*3/uL (ref 0.0–0.1)
Basophils Relative: 0.6 % (ref 0.0–3.0)
EOS ABS: 0 10*3/uL (ref 0.0–0.7)
Eosinophils Relative: 1.1 % (ref 0.0–5.0)
HCT: 41.5 % (ref 39.0–52.0)
Hemoglobin: 13.5 g/dL (ref 13.0–17.0)
LYMPHS ABS: 2.2 10*3/uL (ref 0.7–4.0)
Lymphocytes Relative: 52.6 % — ABNORMAL HIGH (ref 12.0–46.0)
MCHC: 32.6 g/dL (ref 30.0–36.0)
MCV: 82.2 fl (ref 78.0–100.0)
Monocytes Absolute: 0.3 10*3/uL (ref 0.1–1.0)
Monocytes Relative: 6.5 % (ref 3.0–12.0)
NEUTROS ABS: 1.6 10*3/uL (ref 1.4–7.7)
Neutrophils Relative %: 39.2 % — ABNORMAL LOW (ref 43.0–77.0)
Platelets: 199 10*3/uL (ref 150.0–400.0)
RBC: 5.05 Mil/uL (ref 4.22–5.81)
RDW: 13.5 % (ref 11.5–15.5)
WBC: 4.1 10*3/uL (ref 4.0–10.5)

## 2015-08-26 LAB — LIPID PANEL
Cholesterol: 175 mg/dL (ref 0–200)
HDL: 41 mg/dL (ref >39.00)
LDL CALC: 108 mg/dL — AB (ref 0–99)
NONHDL: 134.07
Total CHOL/HDL Ratio: 4
Triglycerides: 132 mg/dL (ref 0.0–149.0)
VLDL: 26.4 mg/dL (ref 0.0–40.0)

## 2015-08-26 LAB — HEPATIC FUNCTION PANEL
ALK PHOS: 89 U/L (ref 39–117)
ALT: 22 U/L (ref 0–53)
AST: 24 U/L (ref 0–37)
Albumin: 4.6 g/dL (ref 3.5–5.2)
BILIRUBIN DIRECT: 0.1 mg/dL (ref 0.0–0.3)
BILIRUBIN TOTAL: 0.5 mg/dL (ref 0.2–1.2)
TOTAL PROTEIN: 7.7 g/dL (ref 6.0–8.3)

## 2015-08-26 LAB — PSA: PSA: 0.75 ng/mL (ref 0.10–4.00)

## 2015-08-26 LAB — TSH: TSH: 0.91 u[IU]/mL (ref 0.35–4.50)

## 2015-09-02 ENCOUNTER — Ambulatory Visit (INDEPENDENT_AMBULATORY_CARE_PROVIDER_SITE_OTHER): Payer: BLUE CROSS/BLUE SHIELD | Admitting: Adult Health

## 2015-09-02 ENCOUNTER — Encounter: Payer: Self-pay | Admitting: Adult Health

## 2015-09-02 VITALS — BP 138/100 | HR 64 | Temp 98.3°F | Ht 73.5 in | Wt 251.1 lb

## 2015-09-02 DIAGNOSIS — Z0000 Encounter for general adult medical examination without abnormal findings: Secondary | ICD-10-CM | POA: Diagnosis not present

## 2015-09-02 DIAGNOSIS — I10 Essential (primary) hypertension: Secondary | ICD-10-CM | POA: Diagnosis not present

## 2015-09-02 MED ORDER — LISINOPRIL 5 MG PO TABS: 5.0000 mg | ORAL_TABLET | Freq: Every day | ORAL | Status: DC

## 2015-09-02 NOTE — Progress Notes (Signed)
Subjective:    Patient ID: Jerry Kaiser, male    DOB: 1965-09-18, 50 y.o.   MRN: [id number]  HPI Patient presents for yearly preventative medicine examination. He is a very pleasant AA male who  has a past medical history of Hypertension.  All immunizations and health maintenance protocols were reviewed with the patient and needed orders were placed.  Appropriate screening laboratory values were ordered for the patient including screening of hyperlipidemia, renal function and hepatic function. If indicated by BPH, a PSA was ordered.  Medication reconciliation,  past medical history, social history, problem list and allergies were reviewed in detail with the patient  Goals were established with regard to weight loss, exercise, and  diet in compliance with medications  He has no acute complaints.  Review of Systems  Constitutional: Negative.   HENT: Negative.   Eyes: Negative.   Respiratory: Negative.   Cardiovascular: Negative.   Gastrointestinal: Negative.   Endocrine: Negative.   Genitourinary: Negative.   Musculoskeletal: Negative.   Skin: Negative.   Allergic/Immunologic: Negative.   Neurological: Negative.   Hematological: Negative.   Psychiatric/Behavioral: Negative.   All other systems reviewed and are negative.  Past Medical History  Diagnosis Date  . Hypertension     Social History   Social History  . Marital Status: Single    Spouse Name: N/A  . Number of Children: N/A  . Years of Education: N/A   Occupational History  . Not on file.   Social History Main Topics  . Smoking status: Former Research scientist (life sciences)  . Smokeless tobacco: Not on file  . Alcohol Use: No  . Drug Use: No  . Sexual Activity: Yes    Birth Control/ Protection: None   Other Topics Concern  . 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 Cataract extraction      left    . Appendectomy      Family History  Problem Relation Age of Onset  . Heart failure Mother     No Known Allergies  Current Outpatient Prescriptions on File Prior to Visit  Medication Sig Dispense Refill  . lisinopril (PRINIVIL,ZESTRIL) 10 MG tablet Take 1 tablet (10 mg total) by mouth daily. 30 tablet 6  . ibuprofen (ADVIL,MOTRIN) 200 MG tablet Take 600 mg by mouth 3 (three) times daily as needed for pain. Reported on 09/02/2015     No current facility-administered medications on file prior to visit.    BP 138/100 mmHg  Pulse 64  Temp(Src) 98.3 F (36.8 C) (Oral)  Ht 6' 1.5" (1.867 m)  Wt 251 lb 1.6 oz (113.898 kg)  BMI 32.68 kg/m2  SpO2 98%       Objective:   Physical Exam  Constitutional: He is oriented to person, place, and time. He appears well-developed and well-nourished. No distress.  HENT:  Head: Normocephalic and atraumatic.  Right Ear: External ear normal.  Left Ear: External ear normal.  Nose: Nose normal.  Mouth/Throat: Oropharynx is clear and moist. No oropharyngeal exudate.  Blind in left eye  Eyes: Conjunctivae and EOM are normal. Pupils are equal, round, and reactive to light. Right eye exhibits no discharge. Left eye exhibits no discharge. No scleral icterus.  Neck: Normal range of motion. Neck supple. No JVD present. No tracheal deviation present. No thyromegaly present.  Cardiovascular: Normal rate, regular rhythm, normal heart sounds and intact distal pulses.  Exam reveals no gallop and no friction rub.   No murmur heard. No carotid bruit   Pulmonary/Chest: Effort normal and breath sounds normal. No stridor. No respiratory distress. He has no wheezes. He has no rales. He exhibits no tenderness.  Abdominal: Soft. Bowel sounds are normal. He exhibits no distension and no mass. There is no tenderness. There is no rebound.  Genitourinary:  Deferred  Musculoskeletal: Normal range of motion. He exhibits no edema or tenderness.  Lymphadenopathy:    He  has no cervical adenopathy.  Neurological: He is alert and oriented to person, place, and time. He has normal reflexes. He displays normal reflexes. No cranial nerve deficit. Coordination normal.  Skin: Skin is warm and dry. No rash noted. He is not diaphoretic. No erythema. No pallor.  Surgical scar to back of head.  Psychiatric: He has a normal mood and affect. His behavior is normal. Judgment and thought content normal.  Nursing note and vitals reviewed.     Assessment & Plan:  1. Routine general medical examination at a health care facility - Follow up in one year for CPE - due for Colonoscopy at that time - Follow up sooner if needed - Continue to eat healthy and exercise - Reviewed labs in detail   2. Essential hypertension - lisinopril (PRINIVIL,ZESTRIL) 5 MG tablet; Take 1 tablet (5 mg total) by mouth daily.  Dispense: 90 tablet; Refill: 3 - Continue with 10 mg Lisinopril.  - Almost at goal, follow up in 1-2 months

## 2015-09-02 NOTE — Patient Instructions (Addendum)
It was great seeing you again!  Your exam is great! Congrats on the weight loss, keep it up!  I have added 5 mg of lisinopril, take this with the 10 you are already taking.   Follow up with me in 2 months for a recheck.   If you need anything in the meantime, please let me know.   Health Maintenance, Male A healthy lifestyle and preventative care can promote health and wellness.  Maintain regular health, dental, and eye exams.  Eat a healthy diet. Foods like vegetables, fruits, whole grains, low-fat dairy products, and lean protein foods contain the nutrients you need and are low in calories. Decrease your intake of foods high in solid fats, added sugars, and salt. Get information about a proper diet from your health care provider, if necessary.  Regular physical exercise is one of the most important things you can do for your health. Most adults should get at least 150 minutes of moderate-intensity exercise (any activity that increases your heart rate and causes you to sweat) each week. In addition, most adults need muscle-strengthening exercises on 2 or more days a week.   Maintain a healthy weight. The body mass index (BMI) is a screening tool to identify possible weight problems. It provides an estimate of body fat based on height and weight. Your health care provider can find your BMI and can help you achieve or maintain a healthy weight. For males 20 years and older:  A BMI below 18.5 is considered underweight.  A BMI of 18.5 to 24.9 is normal.  A BMI of 25 to 29.9 is considered overweight.  A BMI of 30 and above is considered obese.  Maintain normal blood lipids and cholesterol by exercising and minimizing your intake of saturated fat. Eat a balanced diet with plenty of fruits and vegetables. Blood tests for lipids and cholesterol should begin at age 51 and be repeated every 5 years. If your lipid or cholesterol levels are high, you are over age 62, or you are at high risk for  heart disease, you may need your cholesterol levels checked more frequently.Ongoing high lipid and cholesterol levels should be treated with medicines if diet and exercise are not working.  If you smoke, find out from your health care provider how to quit. If you do not use tobacco, do not start.  Lung cancer screening is recommended for adults aged 66-80 years who are at high risk for developing lung cancer because of a history of smoking. A yearly low-dose CT scan of the lungs is recommended for people who have at least a 30-pack-year history of smoking and are current smokers or have quit within the past 15 years. A pack year of smoking is smoking an average of 1 pack of cigarettes a day for 1 year (for example, a 30-pack-year history of smoking could mean smoking 1 pack a day for 30 years or 2 packs a day for 15 years). Yearly screening should continue until the smoker has stopped smoking for at least 15 years. Yearly screening should be stopped for people who develop a health problem that would prevent them from having lung cancer treatment.  If you choose to drink alcohol, do not have more than 2 drinks per day. One drink is considered to be 12 oz (360 mL) of beer, 5 oz (150 mL) of wine, or 1.5 oz (45 mL) of liquor.  Avoid the use of street drugs. Do not share needles with anyone. Ask for help if  you need support or instructions about stopping the use of drugs.  High blood pressure causes heart disease and increases the risk of stroke. High blood pressure is more likely to develop in:  People who have blood pressure in the end of the normal range (100-139/85-89 mm Hg).  People who are overweight or obese.  People who are African American.  If you are 58-84 years of age, have your blood pressure checked every 3-5 years. If you are 65 years of age or older, have your blood pressure checked every year. You should have your blood pressure measured twice--once when you are at a hospital or  clinic, and once when you are not at a hospital or clinic. Record the average of the two measurements. To check your blood pressure when you are not at a hospital or clinic, you can use:  An automated blood pressure machine at a pharmacy.  A home blood pressure monitor.  If you are 39-76 years old, ask your health care provider if you should take aspirin to prevent heart disease.  Diabetes screening involves taking a blood sample to check your fasting blood sugar level. This should be done once every 3 years after age 85 if you are at a normal weight and without risk factors for diabetes. Testing should be considered at a younger age or be carried out more frequently if you are overweight and have at least 1 risk factor for diabetes.  Colorectal cancer can be detected and often prevented. Most routine colorectal cancer screening begins at the age of 59 and continues through age 61. However, your health care provider may recommend screening at an earlier age if you have risk factors for colon cancer. On a yearly basis, your health care provider may provide home test kits to check for hidden blood in the stool. A small camera at the end of a tube may be used to directly examine the colon (sigmoidoscopy or colonoscopy) to detect the earliest forms of colorectal cancer. Talk to your health care provider about this at age 35 when routine screening begins. A direct exam of the colon should be repeated every 5-10 years through age 48, unless early forms of precancerous polyps or small growths are found.  People who are at an increased risk for hepatitis B should be screened for this virus. You are considered at high risk for hepatitis B if:  You were born in a country where hepatitis B occurs often. Talk with your health care provider about which countries are considered high risk.  Your parents were born in a high-risk country and you have not received a shot to protect against hepatitis B (hepatitis B  vaccine).  You have HIV or AIDS.  You use needles to inject street drugs.  You live with, or have sex with, someone who has hepatitis B.  You are a man who has sex with other men (MSM).  You get hemodialysis treatment.  You take certain medicines for conditions like cancer, organ transplantation, and autoimmune conditions.  Hepatitis C blood testing is recommended for all people born from [birth year] through 1965 and any individual with known risk factors for hepatitis C.  Healthy men should no longer receive prostate-specific antigen (PSA) blood tests as part of routine cancer screening. Talk to your health care provider about prostate cancer screening.  Testicular cancer screening is not recommended for adolescents or adult males who have no symptoms. Screening includes self-exam, a health care provider exam, and other screening tests. Consult  with your health care provider about any symptoms you have or any concerns you have about testicular cancer.  Practice safe sex. Use condoms and avoid high-risk sexual practices to reduce the spread of sexually transmitted infections (STIs).  You should be screened for STIs, including gonorrhea and chlamydia if:  You are sexually active and are younger than 24 years.  You are older than 24 years, and your health care provider tells you that you are at risk for this type of infection.  Your sexual activity has changed since you were last screened, and you are at an increased risk for chlamydia or gonorrhea. Ask your health care provider if you are at risk.  If you are at risk of being infected with HIV, it is recommended that you take a prescription medicine daily to prevent HIV infection. This is called pre-exposure prophylaxis (PrEP). You are considered at risk if:  You are a man who has sex with other men (MSM).  You are a heterosexual man who is sexually active with multiple partners.  You take drugs by injection.  You are sexually active  with a partner who has HIV.  Talk with your health care provider about whether you are at high risk of being infected with HIV. If you choose to begin PrEP, you should first be tested for HIV. You should then be tested every 3 months for as long as you are taking PrEP.  Use sunscreen. Apply sunscreen liberally and repeatedly throughout the day. You should seek shade when your shadow is shorter than you. Protect yourself by wearing long sleeves, pants, a wide-brimmed hat, and sunglasses year round whenever you are outdoors.  Tell your health care provider of new moles or changes in moles, especially if there is a change in shape or color. Also, tell your health care provider if a mole is larger than the size of a pencil eraser.  A one-time screening for abdominal aortic aneurysm (AAA) and surgical repair of large AAAs by ultrasound is recommended for men aged 14-75 years who are current or former smokers.  Stay current with your vaccines (immunizations).   This information is not intended to replace advice given to you by your health care provider. Make sure you discuss any questions you have with your health care provider.   Document Released: 12/04/2007 Document Revised: 06/28/2014 Document Reviewed: 11/02/2010 Elsevier Interactive Patient Education [employer].

## 2015-09-02 NOTE — Addendum Note (Signed)
Addended by: Apolinar Junes on: 09/02/2015 12:50 PM   Modules accepted: SmartSet

## 2015-09-02 NOTE — Progress Notes (Signed)
Pre visit review using our clinic review tool, if applicable. No additional management support is needed unless otherwise documented below in the visit note. 

## 2015-10-16 ENCOUNTER — Other Ambulatory Visit: Payer: Self-pay | Admitting: Adult Health

## 2015-10-16 DIAGNOSIS — I10 Essential (primary) hypertension: Secondary | ICD-10-CM

## 2015-10-16 NOTE — Telephone Encounter (Signed)
Pt need new Rx for Lisinopril 10 mg     Pharm:  [facility] [address]

## 2015-10-20 MED ORDER — LISINOPRIL 10 MG PO TABS: 10.0000 mg | ORAL_TABLET | Freq: Every day | ORAL | Status: DC

## 2015-10-20 NOTE — Telephone Encounter (Signed)
Rx sent to pharmacy   

## 2015-11-27 ENCOUNTER — Other Ambulatory Visit: Payer: Self-pay | Admitting: General Practice

## 2015-11-27 DIAGNOSIS — I10 Essential (primary) hypertension: Secondary | ICD-10-CM

## 2015-11-27 MED ORDER — LISINOPRIL 10 MG PO TABS: 10.0000 mg | ORAL_TABLET | Freq: Every day | ORAL | Status: DC

## 2016-02-19 ENCOUNTER — Encounter (HOSPITAL_COMMUNITY): Payer: Self-pay | Admitting: Emergency Medicine

## 2016-02-19 ENCOUNTER — Emergency Department (HOSPITAL_COMMUNITY)
Admission: EM | Admit: 2016-02-19 | Discharge: 2016-02-19 | Disposition: A | Discharge status: Home / Self Care | Payer: BLUE CROSS/BLUE SHIELD | Attending: Emergency Medicine | Admitting: Emergency Medicine

## 2016-02-19 DIAGNOSIS — Y939 Activity, unspecified: Secondary | ICD-10-CM | POA: Diagnosis not present

## 2016-02-19 DIAGNOSIS — Y999 Unspecified external cause status: Secondary | ICD-10-CM | POA: Diagnosis not present

## 2016-02-19 DIAGNOSIS — W1830XA Fall on same level, unspecified, initial encounter: Secondary | ICD-10-CM | POA: Insufficient documentation

## 2016-02-19 DIAGNOSIS — S91312A Laceration without foreign body, left foot, initial encounter: Secondary | ICD-10-CM | POA: Diagnosis not present

## 2016-02-19 DIAGNOSIS — Z87891 Personal history of nicotine dependence: Secondary | ICD-10-CM | POA: Insufficient documentation

## 2016-02-19 DIAGNOSIS — Y929 Unspecified place or not applicable: Secondary | ICD-10-CM | POA: Insufficient documentation

## 2016-02-19 DIAGNOSIS — I10 Essential (primary) hypertension: Secondary | ICD-10-CM | POA: Diagnosis not present

## 2016-02-19 NOTE — ED Triage Notes (Signed)
Pt states yesterday evening he stood up on a small wooden stool that collapsed and broke, pt had scrape on L leg bleeing is controlled. Small puncture wound to bottom of foot, and small blister on heel. Up to date on tetanus

## 2016-02-19 NOTE — ED Provider Notes (Signed)
[facility] DEPT Provider Note   CSN: [id number] Arrival date & time: 02/19/16  1901  By signing my name below, I, Irene Pap, attest that this documentation has been prepared under the direction and in the presence of Etta Quill, NP-C. Electronically Signed: Irene Pap, ED Scribe. 02/19/16. 7:52 PM.  History   Chief Complaint Chief Complaint  Patient presents with  . Foot Injury  . Laceration   The history is provided by the patient. No language interpreter was used.  Foot Injury   The incident occurred yesterday. The incident occurred at home. The injury mechanism was a fall. The pain is present in the left leg and left foot. The pain is mild. The pain has been constant since onset. Pertinent negatives include no numbness and no muscle weakness.  Laceration   The laceration is located on the left leg and left foot. Injury mechanism: wooden stool. He reports no foreign bodies present. His tetanus status is UTD.  HPI Comments: Jerry Kaiser is a 50 y.o. Male with a hx of HTN who presents to the Emergency Department complaining of lacerations to the left lateral lower leg and the bottom of the left foot onset one day ago. Pt states that he was standing on a wooden stool when it broke beneath him and he cut his leg on the broken stool. Pt has cleaned and bandaged the areas. He denies numbness or weakness. Pt is utd on tdap. He is not diabetic.   Past Medical History:  Diagnosis Date  . Hypertension     Patient Active Problem List   Diagnosis Date Noted  . Essential hypertension 05/20/2015    Past Surgical History:  Procedure Laterality Date  . APPENDECTOMY    . CATARACT EXTRACTION     left       Home Medications    Prior to Admission medications   Medication Sig Start Date End Date Taking? Authorizing Provider  ibuprofen (ADVIL,MOTRIN) 200 MG tablet Take 600 mg by mouth 3 (three) times daily as needed for pain. Reported on 09/02/2015    Historical Provider,  MD  lisinopril (PRINIVIL,ZESTRIL) 10 MG tablet Take 1 tablet (10 mg total) by mouth daily. 11/27/15   Dorothyann Peng, NP    Family History Family History  Problem Relation Age of Onset  . Heart failure Mother     Social History Social History  Substance Use Topics  . Smoking status: Former Research scientist (life sciences)  . Smokeless tobacco: Not on file  . Alcohol use No     Allergies   Review of patient's allergies indicates no known allergies.   Review of Systems Review of Systems  Musculoskeletal: Positive for arthralgias.  Skin: Positive for wound.  Neurological: Negative for weakness and numbness.  All other systems reviewed and are negative.    Physical Exam Updated Vital Signs BP 142/95   Pulse 70   Temp 98.5 F (36.9 C) (Oral)   Resp 18   SpO2 100%   Physical Exam  Constitutional: He is oriented to person, place, and time. He appears well-developed and well-nourished.  HENT:  Head: Normocephalic and atraumatic.  Eyes: Conjunctivae and EOM are normal. Pupils are equal, round, and reactive to light.  Neck: Normal range of motion. Neck supple.  Cardiovascular: Normal rate and regular rhythm.   Pulmonary/Chest: Effort normal.  Musculoskeletal: Normal range of motion.  See photos for left lower extremity wounds; thick toenails most likely secondary to fungal infection  Neurological: He is alert and oriented to person, place,  and time.  Skin: Skin is warm and dry.  Psychiatric: He has a normal mood and affect. His behavior is normal.  Nursing note and vitals reviewed.          ED Treatments / Results  DIAGNOSTIC STUDIES: Oxygen Saturation is 100% on RA, normal by my interpretation.    COORDINATION OF CARE: 7:48 PM-Discussed treatment plan which includes wound care with pt at bedside and pt agreed to plan.    Labs (all labs ordered are listed, but only abnormal results are displayed) Labs Reviewed - No data to display  EKG  EKG Interpretation None        Radiology No results found.  Procedures Procedures (including critical care time)  Medications Ordered in ED Medications - No data to display   Initial Impression / Assessment and Plan / ED Course  I have reviewed the triage vital signs and the nursing notes.  Pertinent labs & imaging results that were available during my care of the patient were reviewed by me and considered in my medical decision making (see chart for details).  Clinical Course  Patient with superficial wounds to left foot and left lower leg which occurred from accident at home yesterday. Tetanus up to date. No signs of infection. Wound care provided. Care instructions and return precautions discussed.    Final Clinical Impressions(s) / ED Diagnoses   Final diagnoses:  None  I personally performed the services described in this documentation, which was scribed in my presence. The recorded information has been reviewed and is accurate.    New Prescriptions New Prescriptions   No medications on file     Etta Quill, NP 02/20/16 [id number]    Ripley Fraise, MD 02/24/16 2016

## 2016-02-19 NOTE — ED Notes (Addendum)
Soaked patient's foot in iodine and sterile water.Marland KitchenMarland Kitchen

## 2016-09-10 ENCOUNTER — Other Ambulatory Visit: Payer: Self-pay | Admitting: Adult Health

## 2016-09-10 DIAGNOSIS — I10 Essential (primary) hypertension: Secondary | ICD-10-CM

## 2016-09-10 NOTE — Telephone Encounter (Signed)
This patient was last seen for a physical 09/05/15 - no upcoming appt.  Rx last prescribed 11/27/15 for #90 with 1 refill. Is this ok to refill?

## 2016-09-10 NOTE — Telephone Encounter (Signed)
Ok to refill for 30 days. Needs physical

## 2016-10-17 ENCOUNTER — Other Ambulatory Visit: Payer: Self-pay | Admitting: Adult Health

## 2016-10-17 DIAGNOSIS — I10 Essential (primary) hypertension: Secondary | ICD-10-CM

## 2016-12-06 ENCOUNTER — Ambulatory Visit (HOSPITAL_COMMUNITY)
Admission: EM | Admit: 2016-12-06 | Discharge: 2016-12-06 | Disposition: A | Discharge status: Home / Self Care | Payer: BLUE CROSS/BLUE SHIELD | Attending: Family Medicine | Admitting: Family Medicine

## 2016-12-06 ENCOUNTER — Encounter (HOSPITAL_COMMUNITY): Payer: Self-pay | Admitting: Emergency Medicine

## 2016-12-06 DIAGNOSIS — J039 Acute tonsillitis, unspecified: Secondary | ICD-10-CM

## 2016-12-06 MED ORDER — PENICILLIN V POTASSIUM 500 MG PO TABS: 500.0000 mg | ORAL_TABLET | Freq: Three times a day (TID) | ORAL | 0 refills | Status: DC

## 2016-12-06 NOTE — ED Triage Notes (Signed)
The patient presented to the [facility] with a complaint of a sore throat x 1 day.

## 2016-12-06 NOTE — ED Provider Notes (Signed)
  [facility]   [id number] 12/06/16 Arrival Time: [place]:  1. Tonsillitis     Meds ordered this encounter  Medications  . penicillin v potassium (VEETID) 500 MG tablet    Sig: Take 1 tablet (500 mg total) by mouth 3 (three) times daily.    Dispense:  30 tablet    Refill:  0    Reviewed expectations re: course of current medical issues. Questions answered. Outlined signs and symptoms indicating need for more acute intervention. Follow up here or in the Emergency Department if worsening. Patient verbalized understanding. After Visit Summary given.    SUBJECTIVE:  Jerry Kaiser is a 51 y.o. male who presents with complaint of sore throat for the last 24 hours. He has associated left upper neck swelling and soreness and fever. Pain is worse when he tries to swallow.  ROS: As per HPI.  Past Medical History:  Diagnosis Date  . Hypertension     Immunization History  Administered Date(s) Administered  . Influenza,inj,Quad PF,36+ Mos 05/13/2015  . Tdap 06/21/2014     OBJECTIVE:  Vitals:   12/06/16 1532  BP: (!) 142/92  Pulse: 93  Resp: 16  Temp: 99.8 F (37.7 C)  TempSrc: Oral  SpO2: 96%     General appearance: alert, cooperative, appears stated age and no distress Head: Normocephalic, without obvious abnormality, atraumatic Eyes: conjunctivae/corneas clear. Blind in left eye Ears: normal TM's and external ear canals both ears Nose: Nares normal. Mucosa normal. No drainage or sinus tenderness. Throat: Moderate swelling left tonsillar pillar with diffuse erythema in that region. Neck: Mild left cervical adenopathy and supple, symmetrical, trachea midline Back: symmetric, no curvature. ROM normal. No CVA tenderness.  Extremities: extremities normal, atraumatic, no cyanosis or edema Skin: Skin color, texture, turgor normal. No rashes or lesions Lymph nodes: no lymphadenopathy Neurologic: Alert and oriented X 3, normal strength and  tone. Normal symmetric reflexes. Normal gait    Labs Reviewed - No data to display  No results found.  No Known Allergies  PMHx, SurgHx, SocialHx, Medications, and Allergies were reviewed in the Visit Navigator and updated as appropriate.       Robyn Haber, MD 12/06/16 [id number]

## 2016-12-08 ENCOUNTER — Emergency Department (HOSPITAL_COMMUNITY)
Admission: EM | Admit: 2016-12-08 | Discharge: 2016-12-08 | Disposition: A | Discharge status: Home / Self Care | Payer: BLUE CROSS/BLUE SHIELD | Attending: Emergency Medicine | Admitting: Emergency Medicine

## 2016-12-08 ENCOUNTER — Ambulatory Visit (HOSPITAL_COMMUNITY)
Admission: EM | Admit: 2016-12-08 | Discharge: 2016-12-08 | Disposition: A | Discharge status: Nursing Home (Includes ICF) | Payer: BLUE CROSS/BLUE SHIELD | Source: Home / Self Care

## 2016-12-08 ENCOUNTER — Encounter (HOSPITAL_COMMUNITY): Payer: Self-pay | Admitting: Emergency Medicine

## 2016-12-08 DIAGNOSIS — J029 Acute pharyngitis, unspecified: Secondary | ICD-10-CM | POA: Diagnosis present

## 2016-12-08 DIAGNOSIS — I10 Essential (primary) hypertension: Secondary | ICD-10-CM | POA: Insufficient documentation

## 2016-12-08 DIAGNOSIS — J039 Acute tonsillitis, unspecified: Secondary | ICD-10-CM | POA: Diagnosis not present

## 2016-12-08 DIAGNOSIS — J36 Peritonsillar abscess: Secondary | ICD-10-CM | POA: Insufficient documentation

## 2016-12-08 DIAGNOSIS — Z87891 Personal history of nicotine dependence: Secondary | ICD-10-CM | POA: Insufficient documentation

## 2016-12-08 DIAGNOSIS — Z79899 Other long term (current) drug therapy: Secondary | ICD-10-CM | POA: Insufficient documentation

## 2016-12-08 LAB — RAPID STREP SCREEN (MED CTR MEBANE ONLY): Streptococcus, Group A Screen (Direct): NEGATIVE

## 2016-12-08 MED ORDER — HYDROCODONE-ACETAMINOPHEN 5-325 MG PO TABS: 1.0000 | ORAL_TABLET | ORAL | 0 refills | Status: DC | PRN

## 2016-12-08 MED ORDER — DEXAMETHASONE 4 MG PO TABS: 10.0000 mg | ORAL_TABLET | Freq: Once | ORAL | Status: DC

## 2016-12-08 MED ORDER — ACETAMINOPHEN 325 MG PO TABS
650.0000 mg | ORAL_TABLET | Freq: Once | ORAL | Status: AC
  Administered 2016-12-08: 650 mg via ORAL

## 2016-12-08 MED ORDER — ACETAMINOPHEN 325 MG PO TABS
ORAL_TABLET | ORAL | Status: AC
  Filled 2016-12-08: qty 2

## 2016-12-08 MED ORDER — CLINDAMYCIN HCL 300 MG PO CAPS: 300.0000 mg | ORAL_CAPSULE | Freq: Three times a day (TID) | ORAL | 0 refills | Status: DC

## 2016-12-08 NOTE — ED Notes (Signed)
Denies fever, N/V/D.  Complaining of sore throat and cough.  Placed on antibiotics Monday with no relief.  Negative strep test.  Hoarse voice noted.  States it hurts to talk and swallow.

## 2016-12-08 NOTE — ED Provider Notes (Signed)
CSN: [id number]     Arrival date & time 12/08/16  0867 History   First MD Initiated Contact with Patient 12/08/16 1017     Chief Complaint  Patient presents with  . URI    51 yo male comes in with worsening of sore throat/throat swelling after being treated for tonsillitis 2 days ago. He has been compliant with Penicillin VK mg TID but has seen no improvement. States sore throat is worsening and is very painful to swallow, also causing his voice to be hoarse. Left side throat swelling and pain worse than the right. States side of his left neck is more swollen than usual. Tmax at home around 39F. He admits to some coughing, though he tries to suppress due to pain. Denies sinus pressure, nasal congestion, ear/eye discomfort. Some wheezing when laying down due to pain of the throat. Denies trouble breathing, and although painful, he is able to swallow. Has been taking ibuprofen for pain with no relief. Denies dental problems, last seen by dentist 1 year ago.       Past Medical History:  Diagnosis Date  . Hypertension    Past Surgical History:  Procedure Laterality Date  . APPENDECTOMY    . CATARACT EXTRACTION     left   Family History  Problem Relation Age of Onset  . Heart failure Mother    Social History  Substance Use Topics  . Smoking status: Former Research scientist (life sciences)  . Smokeless tobacco: Not on file  . Alcohol use No    Review of Systems  Constitutional: Negative for chills, fatigue and fever.  HENT: Positive for sore throat and trouble swallowing. Negative for congestion, dental problem, drooling, ear discharge, ear pain, facial swelling, postnasal drip, rhinorrhea, sinus pain and sinus pressure.   Eyes: Negative for photophobia, pain, discharge, redness, itching and visual disturbance.  Respiratory: Positive for cough and wheezing. Negative for apnea, choking, chest tightness and shortness of breath.   Cardiovascular: Negative for chest pain and palpitations.  Gastrointestinal:  Negative for abdominal pain, blood in stool, diarrhea, nausea and vomiting.    Allergies  Patient has no known allergies.  Home Medications   Prior to Admission medications   Medication Sig Start Date End Date Taking? Authorizing Provider  ibuprofen (ADVIL,MOTRIN) 200 MG tablet Take 200 mg by mouth every 6 (six) hours as needed.   Yes [provider]  penicillin v potassium (VEETID) 500 MG tablet Take 1 tablet (500 mg total) by mouth 3 (three) times daily. 12/06/16   Robyn Haber, MD   Meds Ordered and Administered this Visit   Medications  acetaminophen (TYLENOL) tablet 650 mg (650 mg Oral Given 12/08/16 1018)    BP (!) 167/95   Pulse 86   Temp (!) 101.2 F (38.4 C) (Oral)   Resp (!) 22   SpO2 100%  No data found.   Physical Exam  Constitutional: He is oriented to person, place, and time. He appears well-developed and well-nourished. No distress.  Sitting on exam table comfortably. Voice is hoarse with "hot potato" voice.   HENT:  Head: Normocephalic and atraumatic.  Right Ear: Tympanic membrane and external ear normal.  Left Ear: Tympanic membrane and external ear normal.  Nose: Nose normal. Right sinus exhibits no maxillary sinus tenderness and no frontal sinus tenderness. Left sinus exhibits no maxillary sinus tenderness and no frontal sinus tenderness.  Canal with mild cerumen.   Left tonsillar pillar swollen and erythematous, close to midline. Slight deviation of uvula from midline.  Neck:  Left neck slightly swollen compared to right.  Cardiovascular: Normal rate and regular rhythm.  Exam reveals no gallop and no friction rub.   No murmur heard. Pulmonary/Chest: Effort normal and breath sounds normal. No respiratory distress. He has no wheezes.  Lymphadenopathy:    He has cervical adenopathy.  Neurological: He is alert and oriented to person, place, and time.  Skin: Skin is warm and dry.  Psychiatric: He has a normal mood and affect. His behavior is  normal. Judgment normal.    Urgent Care Course     Procedures (including critical care time)  Labs Review Labs Reviewed - No data to display  Imaging Review No results found.     MDM   1. Acute tonsillitis, unspecified etiology   11 to male who has worsening throat swelling and pain despite outpatient penicillin VK x 2 days. Fever at clinic today, and was given Tylenol 650mg . Rapid strep today negative, with cultures pending. Exam with left swollen tonsillar pillar and erythema that is closing in on midline. Patient with "hot potato" voice. Given history and exam, concern for peritonsillar abscess. Discussed case with Dr Joseph Art, who recommended discharge to ED for further evaluation.     Ok Edwards, PA-C 12/08/16 1054    Cathlean Sauer V, PA-C 12/08/16 1058

## 2016-12-08 NOTE — ED Provider Notes (Signed)
[facility] DEPT Provider Note   CSN: [id number] Arrival date & time: 12/08/16  1045  By signing my name below, I, Jeanell Sparrow, attest that this documentation has been prepared under the direction and in the presence of non-physician practitioner, Okey Regal, PA-C. Electronically Signed: Jeanell Sparrow, Scribe. 12/08/2016. 12:51 PM.  History   Chief Complaint Chief Complaint  Patient presents with  . Sore Throat   The history is provided by the patient. No language interpreter was used.   HPI Comments:   Jerry Kaiser is a 51 y.o. male who presents to the Emergency Department complaining of constant moderate sore throat that started about 3 days ago. He was seen at Urgent Care and was started on Penicillin 2 days ago without relief. His pain is exacerbated by swallowing. He reports associated fever (subjective, onset this morning) and cough. He took two Tylenol this morning. Pt's temperature in the ED today was 101.2. Denies any difficulty swallowing, vomiting, or other complaints at this time.    Past Medical History:  Diagnosis Date  . Hypertension     Patient Active Problem List   Diagnosis Date Noted  . Essential hypertension 05/20/2015    Past Surgical History:  Procedure Laterality Date  . APPENDECTOMY    . CATARACT EXTRACTION     left       Home Medications    Prior to Admission medications   Medication Sig Start Date End Date Taking? Authorizing Provider  clindamycin (CLEOCIN) 300 MG capsule Take 1 capsule (300 mg total) by mouth 3 (three) times daily. 12/08/16   Geena Weinhold, Dellis Filbert, PA-C  HYDROcodone-acetaminophen (NORCO/VICODIN) 5-325 MG tablet Take 1 tablet by mouth every 4 (four) hours as needed. 12/08/16   Katelynd Blauvelt, Dellis Filbert, PA-C  ibuprofen (ADVIL,MOTRIN) 200 MG tablet Take 200 mg by mouth every 6 (six) hours as needed.    [provider]  penicillin v potassium (VEETID) 500 MG tablet Take 1 tablet (500 mg total) by mouth 3 (three) times daily.  12/06/16   Robyn Haber, MD    Family History Family History  Problem Relation Age of Onset  . Heart failure Mother     Social History Social History  Substance Use Topics  . Smoking status: Former Research scientist (life sciences)  . Smokeless tobacco: Not on file  . Alcohol use No     Allergies   Patient has no known allergies.   Review of Systems Review of Systems  Constitutional: Positive for fever (subjective).  HENT: Positive for sore throat. Negative for trouble swallowing.   Respiratory: Positive for cough.   Gastrointestinal: Negative for vomiting.  All other systems reviewed and are negative.    Physical Exam Updated Vital Signs BP (!) 157/115   Pulse 81   Temp 98.2 F (36.8 C)   Resp 18   SpO2 100%   Physical Exam  Constitutional: He appears well-developed and well-nourished. No distress.  HENT:  Head: Normocephalic and atraumatic.  Left-sided tonsillar swelling with fullness in the tonsillar pillar crossing to midline. Minor swelling in the right tonsil.   Eyes: Conjunctivae are normal.  Neck: Neck supple.  TTP to the left lateral cervical lymph node.   Cardiovascular: Normal rate.   Pulmonary/Chest: Effort normal.  Abdominal: Soft.  Musculoskeletal: Normal range of motion.  Neurological: He is alert.  Skin: Skin is warm and dry.  Psychiatric: He has a normal mood and affect.  Nursing note and vitals reviewed.    ED Treatments / Results  DIAGNOSTIC STUDIES: Oxygen Saturation is 100%  on RA, normal by my interpretation.    COORDINATION OF CARE: 12:55 PM- Pt advised of plan for treatment and pt agrees.  Labs (all labs ordered are listed, but only abnormal results are displayed) Labs Reviewed  RAPID STREP SCREEN (NOT AT [facility])  CULTURE, GROUP A STREP [facility])    EKG  EKG Interpretation None       Radiology No results found.  Procedures Procedures (including critical care time)  Medications Ordered in ED Medications  dexamethasone (DECADRON) tablet  10 mg (10 mg Oral Not Given 12/08/16 1343)     Initial Impression / Assessment and Plan / ED Course  I have reviewed the triage vital signs and the nursing notes.  Pertinent labs & imaging results that were available during my care of the patient were reviewed by me and considered in my medical decision making (see chart for details).       Final Clinical Impressions(s) / ED Diagnoses   Final diagnoses:  Peritonsillar abscess    Patient's presentation is consistent with peritonsillar abscess.  Patient had a fever earlier today, afebrile while here in the ED.  Patient does not look septic or ill-appearing.  He is able to swallow and tolerate his secretions.  I spoke with ENT on the phone who would see the patient in the office today for evaluation and management.  I feel patient is stable for outpatient follow-up, he is given a dose of Decadron, given a prescription for clindamycin, pain medication with close follow-up with ENT.  Return precautions given.  He verbalized understanding and agreement to today's plan.  New Prescriptions Discharge Medication List as of 12/08/2016  1:27 PM    START taking these medications   Details  clindamycin (CLEOCIN) 300 MG capsule Take 1 capsule (300 mg total) by mouth 3 (three) times daily., Starting Wed 12/08/2016, Print    HYDROcodone-acetaminophen (NORCO/VICODIN) 5-325 MG tablet Take 1 tablet by mouth every 4 (four) hours as needed., Starting Wed 12/08/2016, Print       I personally performed the services described in this documentation, which was scribed in my presence. The recorded information has been reviewed and is accurate.    Okey Regal, PA-C 12/08/16 1354    Daleen Bo, MD 12/08/16 [phone]

## 2016-12-08 NOTE — Discharge Instructions (Signed)
Please follow-up at Dr. Erik Obey office immediately for ongoing management.  Please review attached information.

## 2016-12-08 NOTE — ED Notes (Signed)
Pt acuity increased due to BP. Pt states he is in a lot of pain and does not normally have high BP.

## 2016-12-08 NOTE — ED Triage Notes (Signed)
Symptoms started 6/17.  Patient seen 6/18 at [facility].  Patient reports the throat pain is worse.

## 2016-12-08 NOTE — ED Triage Notes (Signed)
Pt here for evaluation of sore throat. Seen at urgent care and prescribed penicillin. Pt has been taking it for 3 days. Pt reports no relief to swelling/pain.

## 2016-12-10 LAB — CULTURE, GROUP A STREP (THRC)

## 2016-12-13 LAB — POCT RAPID STREP A: Streptococcus, Group A Screen (Direct): NEGATIVE

## 2019-02-13 ENCOUNTER — Ambulatory Visit: Payer: BLUE CROSS/BLUE SHIELD | Admitting: Adult Health

## 2019-02-14 ENCOUNTER — Ambulatory Visit (INDEPENDENT_AMBULATORY_CARE_PROVIDER_SITE_OTHER): Payer: Self-pay | Admitting: Adult Health

## 2019-02-14 ENCOUNTER — Other Ambulatory Visit: Payer: Self-pay

## 2019-02-14 ENCOUNTER — Encounter: Payer: Self-pay | Admitting: Adult Health

## 2019-02-14 VITALS — BP 170/96 | Temp 97.8°F | Wt 261.0 lb

## 2019-02-14 DIAGNOSIS — T148XXA Other injury of unspecified body region, initial encounter: Secondary | ICD-10-CM

## 2019-02-14 DIAGNOSIS — I10 Essential (primary) hypertension: Secondary | ICD-10-CM

## 2019-02-14 MED ORDER — CYCLOBENZAPRINE HCL 10 MG PO TABS: 10.0000 mg | ORAL_TABLET | Freq: Every day | ORAL | 0 refills | Status: DC

## 2019-02-14 MED ORDER — LISINOPRIL 20 MG PO TABS: 20.0000 mg | ORAL_TABLET | Freq: Every day | ORAL | 3 refills | Status: DC

## 2019-02-14 NOTE — Progress Notes (Signed)
Subjective:    Patient ID: Jerry Kaiser, male    DOB: October 01, 1965, 53 y.o.   MRN: [id number]  HPI 53 year old male who  has a past medical history of Hypertension.   He presents to the office today for follow-up after 3-year absence.  He states "I have been feeling pretty good so I figured I did not need to come in".  He has been monitoring his blood pressure at home simply and noticing that his blood pressures have been in the 160s over 90s.  He denies blurred vision or headaches.  He was last on lisinopril 10 mg about 2 years ago.  Patient only he reports intermittent "sharp catching" pain under his right breast that has a radiating pain under his right axilla.  He denies any aggravating factors.  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  At home he has been using Tylenol in the p.m. to help him sleep.  He denies nausea, vomiting, diarrhea, or epigastric pain.  He has not noticed any pain between his shoulder blades  Review of Systems See HPI   Past Medical History:  Diagnosis Date  . Hypertension     Social History   Socioeconomic History  . Marital status: Single    Spouse name: Not on file  . Number of children: Not on file  . Years of education: Not on file  . Highest education level: Not on file  Occupational History  . Not on file  Social Needs  . Financial resource strain: Not on file  . Food insecurity    Worry: Not on file    Inability: Not on file  . Transportation needs    Medical: Not on file    Non-medical: Not on file  Tobacco Use  . Smoking status: Former Smoker  Substance and Sexual Activity  . Alcohol use: No    Alcohol/week: 0.0 standard drinks  . Drug use: No  . Sexual activity: Yes    Birth control/protection: None  Lifestyle  . Physical activity    Days per week: Not on file    Minutes per session: Not on file  .  Stress: Not on file  Relationships  . Social Herbalist on phone: Not on file    Gets together: Not on file    Attends religious service: Not on file    Active member of club or organization: Not on file    Attends meetings of clubs or organizations: Not on file    Relationship status: Not on file  . Intimate partner violence    Fear of current or ex partner: Not on file    Emotionally abused: Not on file    Physically abused: Not on file    Forced sexual activity: Not on file  Other Topics Concern  . 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 APPENDECTOMY    . CATARACT EXTRACTION     left    Family History  Problem Relation Age of Onset  . Heart failure Mother     No Known Allergies  No current outpatient medications on file prior to visit.   No current facility-administered medications on file prior  to visit.     BP (!) 170/96   Temp 97.8 F (36.6 C) (Temporal)   Wt 261 lb (118.4 kg)   BMI 33.97 kg/m       Objective:   Physical Exam Vitals signs and nursing note reviewed.  Constitutional:      Appearance: Normal appearance.  Cardiovascular:     Rate and Rhythm: Normal rate and regular rhythm.     Pulses: Normal pulses.     Heart sounds: Normal heart sounds.  Pulmonary:     Effort: Pulmonary effort is normal.     Breath sounds: Normal breath sounds.  Abdominal:     General: Abdomen is flat.     Palpations: Abdomen is soft.  Musculoskeletal: Normal range of motion.        General: Tenderness (With palpation to the intercostal space under her right breast.) present. No swelling, deformity or signs of injury.     Right lower leg: No edema.     Left lower leg: No edema.  Skin:    General: Skin is warm and dry.  Neurological:     General: No focal deficit present.     Mental Status: He is alert and oriented to person, place, and  time.  Psychiatric:        Mood and Affect: Mood normal.        Behavior: Behavior normal.        Thought Content: Thought content normal.        Judgment: Judgment normal.       Assessment & Plan:  1. Essential hypertension -We will restart on lisinopril but increase the dosage to 20 mg tablets.  Check BMP.  And can have him follow-up in 2 weeks for his CPE - Basic Metabolic Panel - lisinopril (ZESTRIL) 20 MG tablet; Take 1 tablet (20 mg total) by mouth daily.  Dispense: 90 tablet; Refill: 3  2. Muscle strain -Sitter prednisone once we see what his glucose level is.  Vies warm compress and anti-inflammatories.  Follow-up if no improvement - cyclobenzaprine (FLEXERIL) 10 MG tablet; Take 1 tablet (10 mg total) by mouth at bedtime.  Dispense: 10 tablet; Refill: 0  Dorothyann Peng, NP

## 2019-02-14 NOTE — Patient Instructions (Signed)
It was great seeing you again   I have restarted you on lisinopril for blood pressure   I will follow up with you regarding your blood work   Please schedule your physical at the front desk. I would like to see you in two weeks   I have also sent in a muscle relaxer

## 2019-02-15 ENCOUNTER — Other Ambulatory Visit: Payer: Self-pay | Admitting: Adult Health

## 2019-02-15 LAB — BASIC METABOLIC PANEL
BUN: 13 mg/dL (ref 6–23)
CO2: 29 mEq/L (ref 19–32)
Calcium: 9.7 mg/dL (ref 8.4–10.5)
Chloride: 105 mEq/L (ref 96–112)
Creatinine, Ser: 1.43 mg/dL (ref 0.40–1.50)
GFR: 62.63 mL/min (ref >60.00)
Glucose, Bld: 100 mg/dL — ABNORMAL HIGH (ref 70–99)
Potassium: 4 mEq/L (ref 3.5–5.1)
Sodium: 142 mEq/L (ref 135–145)

## 2019-02-15 MED ORDER — METHYLPREDNISOLONE 4 MG PO TBPK: ORAL_TABLET | ORAL | 0 refills | Status: DC

## 2019-03-20 ENCOUNTER — Encounter: Payer: Self-pay | Admitting: Adult Health

## 2019-03-20 ENCOUNTER — Other Ambulatory Visit: Payer: Self-pay

## 2019-03-20 ENCOUNTER — Ambulatory Visit (INDEPENDENT_AMBULATORY_CARE_PROVIDER_SITE_OTHER): Payer: Self-pay

## 2019-03-20 ENCOUNTER — Ambulatory Visit (INDEPENDENT_AMBULATORY_CARE_PROVIDER_SITE_OTHER): Payer: Self-pay | Admitting: Adult Health

## 2019-03-20 VITALS — BP 170/120 | Temp 98.0°F | Ht 73.0 in | Wt 265.0 lb

## 2019-03-20 DIAGNOSIS — R079 Chest pain, unspecified: Secondary | ICD-10-CM

## 2019-03-20 DIAGNOSIS — I10 Essential (primary) hypertension: Secondary | ICD-10-CM

## 2019-03-20 DIAGNOSIS — Z1211 Encounter for screening for malignant neoplasm of colon: Secondary | ICD-10-CM

## 2019-03-20 DIAGNOSIS — Z125 Encounter for screening for malignant neoplasm of prostate: Secondary | ICD-10-CM

## 2019-03-20 DIAGNOSIS — Z0000 Encounter for general adult medical examination without abnormal findings: Secondary | ICD-10-CM

## 2019-03-20 MED ORDER — AMLODIPINE BESYLATE 2.5 MG PO TABS: 2.5000 mg | ORAL_TABLET | Freq: Every day | ORAL | 1 refills | Status: DC

## 2019-03-20 NOTE — Progress Notes (Signed)
Subjective:    Patient ID: Jerry Kaiser, male    DOB: 06/13/1966, 53 y.o.   MRN: [id number]  HPI Patient presents for yearly preventative medicine examination. He is a pleasant 53 year old male who  has a past medical history of Hypertension.  Essential Hypertension -was recently started on lisinopril with the dose increased to 20 mg after 3 or absence from being seen.  He had been monitoring his blood pressures at home with readings in the 160s over 90s.  He denies headaches, blurred vision, chest pain, shortness of breath, dizziness, lightheadedness. He has not been checking his blood pressure at home.   BP Readings from Last 3 Encounters:  02/14/19 (!) 170/96  12/08/16 133/86  12/08/16 (!) 167/95   He continues to have a " sharp catching" sensation under bilateral breasts. Pain is worse at night and with twisting and bending motions. He was prescribed flexeril and medrol dose pack two weeks ago and reports that this did not help his pain.   All immunizations and health maintenance protocols were reviewed with the patient and needed orders were placed.  Appropriate screening laboratory values were ordered for the patient including screening of hyperlipidemia, renal function and hepatic function. If indicated by BPH, a PSA was ordered.  Medication reconciliation,  past medical history, social history, problem list and allergies were reviewed in detail with the patient  Goals were established with regard to weight loss, exercise, and  diet in compliance with medications  End of life planning was discussed.  He is due for screening colonoscopy   Review of Systems  Constitutional: Negative.   HENT: Negative.   Eyes: Negative.   Respiratory: Negative.   Cardiovascular: Positive for chest pain.  Gastrointestinal: Negative.   Endocrine: Negative.   Genitourinary: Negative.   Musculoskeletal: Positive for back pain and myalgias.  Skin: Negative.   Allergic/Immunologic:  Negative.   Neurological: Negative.   Hematological: Negative.   Psychiatric/Behavioral: Negative.   All other systems reviewed and are negative.  Past Medical History:  Diagnosis Date  . Hypertension     Social History   Socioeconomic History  . Marital status: Single    Spouse name: Not on file  . Number of children: Not on file  . Years of education: Not on file  . Highest education level: Not on file  Occupational History  . Not on file  Social Needs  . Financial resource strain: Not on file  . Food insecurity    Worry: Not on file    Inability: Not on file  . Transportation needs    Medical: Not on file    Non-medical: Not on file  Tobacco Use  . Smoking status: Former Smoker  Substance and Sexual Activity  . Alcohol use: No    Alcohol/week: 0.0 standard drinks  . Drug use: No  . Sexual activity: Yes    Birth control/protection: None  Lifestyle  . Physical activity    Days per week: Not on file    Minutes per session: Not on file  . Stress: Not on file  Relationships  . Social Herbalist on phone: Not on file    Gets together: Not on file    Attends religious service: Not on file    Active member of club or organization: Not on file    Attends meetings of clubs or organizations: Not on file    Relationship status: Not on file  . Intimate partner violence  Fear of current or ex partner: Not on file    Emotionally abused: Not on file    Physically abused: Not on file    Forced sexual activity: Not on file  Other Topics Concern  . 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 APPENDECTOMY    . CATARACT EXTRACTION     left    Family History  Problem Relation Age of Onset  . Heart failure Mother     No Known Allergies  Current Outpatient Medications on File Prior to Visit  Medication Sig Dispense Refill  .  cyclobenzaprine (FLEXERIL) 10 MG tablet Take 1 tablet (10 mg total) by mouth at bedtime. 10 tablet 0  . lisinopril (ZESTRIL) 20 MG tablet Take 1 tablet (20 mg total) by mouth daily. 90 tablet 3  . methylPREDNISolone (MEDROL DOSEPAK) 4 MG TBPK tablet Take as directed 21 tablet 0   No current facility-administered medications on file prior to visit.     There were no vitals taken for this visit.      Objective:   Physical Exam Vitals signs and nursing note reviewed.  Constitutional:      General: He is not in acute distress.    Appearance: Normal appearance. He is obese. He is not diaphoretic.  HENT:     Head: Normocephalic and atraumatic.     Right Ear: Tympanic membrane, ear canal and external ear normal. There is no impacted cerumen.     Left Ear: Tympanic membrane, ear canal and external ear normal. There is no impacted cerumen.     Nose: Nose normal. No congestion or rhinorrhea.     Mouth/Throat:     Mouth: Mucous membranes are moist.     Pharynx: Oropharynx is clear. No oropharyngeal exudate or posterior oropharyngeal erythema.  Eyes:     General: No scleral icterus.       Right eye: No discharge.        Left eye: No discharge.     Extraocular Movements: Extraocular movements intact.     Conjunctiva/sclera: Conjunctivae normal.     Pupils: Pupils are equal, round, and reactive to light.  Neck:     Musculoskeletal: Normal range of motion and neck supple.     Thyroid: No thyromegaly.     Vascular: No JVD.     Trachea: No tracheal deviation.  Cardiovascular:     Rate and Rhythm: Normal rate and regular rhythm.     Pulses: Normal pulses.     Heart sounds: Normal heart sounds. No murmur. No friction rub. No gallop.   Pulmonary:     Effort: Pulmonary effort is normal. No respiratory distress.     Breath sounds: Normal breath sounds. No stridor. No wheezing, rhonchi or rales.  Chest:     Chest wall: No tenderness.  Abdominal:     General: Bowel sounds are normal. There is  no distension.     Palpations: Abdomen is soft. There is no mass.     Tenderness: There is no abdominal tenderness. There is no right CVA tenderness, left CVA tenderness, guarding or rebound.     Hernia: No hernia is present.  Musculoskeletal: Normal range of motion.        General: Tenderness (tenderness with palpation under both breasts and at xyphoid process- all boney tenderness) present. No swelling, deformity  or signs of injury.     Right lower leg: No edema.     Left lower leg: No edema.  Lymphadenopathy:     Cervical: No cervical adenopathy.  Skin:    General: Skin is warm and dry.     Capillary Refill: Capillary refill takes less than 2 seconds.     Coloration: Skin is not jaundiced or pale.     Findings: No bruising, erythema, lesion or rash.  Neurological:     General: No focal deficit present.     Mental Status: He is alert and oriented to person, place, and time. Mental status is at baseline.     Cranial Nerves: No cranial nerve deficit.     Sensory: No sensory deficit.     Motor: No weakness or abnormal muscle tone.     Coordination: Coordination normal.     Gait: Gait normal.     Deep Tendon Reflexes: Reflexes are normal and symmetric. Reflexes normal.  Psychiatric:        Mood and Affect: Mood normal.        Behavior: Behavior normal.        Thought Content: Thought content normal.        Judgment: Judgment normal.       Assessment & Plan:  1. Routine general medical examination at a health care facility - Needs to work on weight loss through diet and exercise - Follow up in one year or sooner if needed - CBC with Differential/Platelet - Hemoglobin A1c - Lipid panel - TSH - Comprehensive metabolic panel  2. Essential hypertension - Increase Lisinopril from 20 mg to 40 mg and add Norvasc 2.5 mg  - Follow up in 2 weeks  - CBC with Differential/Platelet - Hemoglobin A1c - Lipid panel - TSH - Comprehensive metabolic panel - EKG [id number] - amLODipine  (NORVASC) 2.5 MG tablet; Take 1 tablet (2.5 mg total) by mouth daily.  Dispense: 30 tablet; Refill: 1  3. Prostate cancer screening  - PSA  4. Colon cancer screening  - Ambulatory referral to Gastroenterology  5. Chest pain, unspecified type - Does not appear cardiac related. Likely MSK.  - EKG 12-Lead- SR, Rate 66. Voltage Criteria for LVH.  - DG Chest 2 View; Future  Dorothyann Peng, NP

## 2019-03-20 NOTE — Patient Instructions (Addendum)
It was great seeing you today   We will follow up with you regarding your blood work and chest xray   I am going to have you increase lisinopril from 20 mg ( 1 pill) to 40 mg ( 2 pills) daily and I have added a medication called Norvasc 2.5 mg   Please follow up in two weeks for blood pressure check   Someone will call you to schedule your colonoscopy

## 2019-04-03 ENCOUNTER — Other Ambulatory Visit: Payer: Self-pay

## 2019-04-03 ENCOUNTER — Ambulatory Visit (INDEPENDENT_AMBULATORY_CARE_PROVIDER_SITE_OTHER): Payer: Self-pay | Admitting: Adult Health

## 2019-04-03 ENCOUNTER — Encounter: Payer: Self-pay | Admitting: Adult Health

## 2019-04-03 DIAGNOSIS — I10 Essential (primary) hypertension: Secondary | ICD-10-CM

## 2019-04-03 MED ORDER — LISINOPRIL 40 MG PO TABS: 40.0000 mg | ORAL_TABLET | Freq: Every day | ORAL | 3 refills | Status: DC

## 2019-04-03 MED ORDER — AMLODIPINE BESYLATE 5 MG PO TABS: 5.0000 mg | ORAL_TABLET | Freq: Every day | ORAL | 3 refills | Status: DC

## 2019-04-03 NOTE — Progress Notes (Signed)
Subjective:    Patient ID: Jerry Kaiser, male    DOB: July 15, 1965, 53 y.o.   MRN: [id number]  HPI 53 year old male who  has a past medical history of Hypertension.  He presents to the office today for follow up regarding hypertension. He was currently prescribed Lisinopril 40 mg and Norvasc 5 mg. He reports that he has been monitoring his blood pressure at home and has been getting readings consistently in the 140's/80's. He denies dizziness, lightheadedness, or blurred vision.    Review of Systems See HPI   Past Medical History:  Diagnosis Date  . Hypertension     Social History   Socioeconomic History  . Marital status: Single    Spouse name: Not on file  . Number of children: Not on file  . Years of education: Not on file  . Highest education level: Not on file  Occupational History  . Not on file  Social Needs  . Financial resource strain: Not on file  . Food insecurity    Worry: Not on file    Inability: Not on file  . Transportation needs    Medical: Not on file    Non-medical: Not on file  Tobacco Use  . Smoking status: Former Research scientist (life sciences)  . Smokeless tobacco: Never Used  Substance and Sexual Activity  . Alcohol use: No    Alcohol/week: 0.0 standard drinks  . Drug use: No  . Sexual activity: Yes    Birth control/protection: None  Lifestyle  . Physical activity    Days per week: Not on file    Minutes per session: Not on file  . Stress: Not on file  Relationships  . Social Herbalist on phone: Not on file    Gets together: Not on file    Attends religious service: Not on file    Active member of club or organization: Not on file    Attends meetings of clubs or organizations: Not on file    Relationship status: Not on file  . Intimate partner violence    Fear of current or ex partner: Not on file    Emotionally abused: Not on file    Physically abused: Not on file    Forced sexual activity: Not on file  Other Topics Concern  . 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 APPENDECTOMY    . CATARACT EXTRACTION     left    Family History  Problem Relation Age of Onset  . Heart failure Mother     No Known Allergies  Current Outpatient Medications on File Prior to Visit  Medication Sig Dispense Refill  . amLODipine (NORVASC) 2.5 MG tablet Take 1 tablet (2.5 mg total) by mouth daily. 30 tablet 1  . lisinopril (ZESTRIL) 20 MG tablet Take 1 tablet (20 mg total) by mouth daily. (Patient taking differently: Take 40 mg by mouth daily. ) 90 tablet 3   No current facility-administered medications on file prior to visit.     BP (!) 170/100   Temp 97.7 F (36.5 C) (Temporal)   Wt 264 lb (119.7 kg)   BMI 34.83 kg/m       Objective:   Physical Exam Vitals signs and nursing note reviewed.  Constitutional:      Appearance: Normal appearance.  HENT:     Nose: Nose normal. No congestion or rhinorrhea.     Mouth/Throat:     Mouth: Mucous membranes are moist.     Pharynx: Oropharynx is clear.  Cardiovascular:     Rate and Rhythm: Normal rate and regular rhythm.     Pulses: Normal pulses.     Heart sounds: Normal heart sounds.  Pulmonary:     Effort: Pulmonary effort is normal.     Breath sounds: Normal breath sounds.  Musculoskeletal:     Right lower leg: No edema.     Left lower leg: No edema.  Skin:    Capillary Refill: Capillary refill takes less than 2 seconds.  Neurological:     General: No focal deficit present.     Mental Status: He is alert and oriented to person, place, and time.  Psychiatric:        Mood and Affect: Mood normal.        Behavior: Behavior normal.        Thought Content: Thought content normal.        Judgment: Judgment normal.        Assessment & Plan:  1. Essential hypertension - Will increase Norvasc to 5 mg. Advised follow up in 2-4 weeks if not at goal  -  amLODipine (NORVASC) 5 MG tablet; Take 1 tablet (5 mg total) by mouth daily.  Dispense: 90 tablet; Refill: 3 - lisinopril (ZESTRIL) 40 MG tablet; Take 1 tablet (40 mg total) by mouth daily.  Dispense: 90 tablet; Refill: 3  Dorothyann Peng, NP

## 2019-04-11 ENCOUNTER — Other Ambulatory Visit: Payer: Self-pay | Admitting: Adult Health

## 2019-04-11 DIAGNOSIS — I10 Essential (primary) hypertension: Secondary | ICD-10-CM

## 2019-04-12 NOTE — Telephone Encounter (Signed)
DENIED.  FILLED ON 04/03/2019 FOR 1 YEAR.

## 2020-03-15 ENCOUNTER — Other Ambulatory Visit: Payer: Self-pay | Admitting: Adult Health

## 2020-03-15 DIAGNOSIS — I10 Essential (primary) hypertension: Secondary | ICD-10-CM

## 2020-04-09 ENCOUNTER — Other Ambulatory Visit: Payer: Self-pay | Admitting: Adult Health

## 2020-04-09 DIAGNOSIS — I10 Essential (primary) hypertension: Secondary | ICD-10-CM

## 2020-06-18 ENCOUNTER — Telehealth: Payer: Self-pay | Admitting: Adult Health

## 2020-06-18 ENCOUNTER — Other Ambulatory Visit: Payer: Self-pay | Admitting: Adult Health

## 2020-06-18 DIAGNOSIS — I10 Essential (primary) hypertension: Secondary | ICD-10-CM

## 2020-06-18 NOTE — Telephone Encounter (Signed)
Pt needs a refill a refill for Lisinopril called in to [facility] on [address]  Patient is out of this prescription. Patient wants a call back once it is called in.

## 2020-06-19 ENCOUNTER — Telehealth: Payer: Self-pay | Admitting: Adult Health

## 2020-06-19 MED ORDER — LISINOPRIL 40 MG PO TABS: 40.0000 mg | ORAL_TABLET | Freq: Every day | ORAL | 0 refills | Status: DC

## 2020-06-19 NOTE — Telephone Encounter (Signed)
Disregard

## 2020-06-19 NOTE — Telephone Encounter (Signed)
Left a detailed message at the pts cell number stating a 30-day supply was sent to [facility] as he has not been seen in over 1 year and generally due to office policy no refills are given at this point.  Left a message requesting the pt call back for an appt.

## 2020-07-11 ENCOUNTER — Other Ambulatory Visit: Payer: Self-pay | Admitting: Adult Health

## 2020-07-11 DIAGNOSIS — I10 Essential (primary) hypertension: Secondary | ICD-10-CM

## 2020-07-11 NOTE — Telephone Encounter (Signed)
Denied.  Pt has not had cpx since 02/2019.

## 2020-07-21 ENCOUNTER — Other Ambulatory Visit: Payer: Self-pay

## 2020-07-21 ENCOUNTER — Encounter (HOSPITAL_COMMUNITY): Payer: Self-pay

## 2020-07-21 ENCOUNTER — Ambulatory Visit (HOSPITAL_COMMUNITY)
Admission: EM | Admit: 2020-07-21 | Discharge: 2020-07-21 | Disposition: A | Discharge status: Home / Self Care | Payer: 59 | Attending: Family Medicine | Admitting: Family Medicine

## 2020-07-21 DIAGNOSIS — I10 Essential (primary) hypertension: Secondary | ICD-10-CM | POA: Insufficient documentation

## 2020-07-21 DIAGNOSIS — Z87891 Personal history of nicotine dependence: Secondary | ICD-10-CM | POA: Diagnosis not present

## 2020-07-21 DIAGNOSIS — J069 Acute upper respiratory infection, unspecified: Secondary | ICD-10-CM | POA: Diagnosis not present

## 2020-07-21 DIAGNOSIS — R519 Headache, unspecified: Secondary | ICD-10-CM | POA: Diagnosis present

## 2020-07-21 DIAGNOSIS — U071 COVID-19: Secondary | ICD-10-CM | POA: Insufficient documentation

## 2020-07-21 DIAGNOSIS — Z8249 Family history of ischemic heart disease and other diseases of the circulatory system: Secondary | ICD-10-CM | POA: Diagnosis not present

## 2020-07-21 MED ORDER — AMLODIPINE BESYLATE 5 MG PO TABS: 5.0000 mg | ORAL_TABLET | Freq: Every day | ORAL | 1 refills | Status: DC

## 2020-07-21 MED ORDER — BENZONATATE 100 MG PO CAPS: ORAL_CAPSULE | ORAL | 0 refills | Status: DC

## 2020-07-21 MED ORDER — LISINOPRIL 40 MG PO TABS: 40.0000 mg | ORAL_TABLET | Freq: Every day | ORAL | 1 refills | Status: DC

## 2020-07-21 NOTE — Discharge Instructions (Addendum)
You have been tested for COVID-19 today. If your test returns positive, you will receive a phone call from [facility] regarding your results. Negative test results are not called. Both positive and negative results area always visible on MyChart. If you do not have a MyChart account, sign up instructions are provided in your discharge papers. Please do not hesitate to contact us should you have questions or concerns.  Your blood pressure was noted to be elevated during your visit today. If you are currently taking medication for high blood pressure, please ensure you are taking this as directed. If you do not have a history of high blood pressure and your blood pressure remains persistently elevated, you may need to begin taking a medication at some point. You may return here within the next few days to recheck if unable to see your primary care provider or if you do not have a one.  BP (!) 150/95 (BP Location: Right Arm)    Pulse 68    Temp 97.7 F (36.5 C) (Temporal)    Resp 16    Wt 115.7 kg    SpO2 99%    BMI 33.64 kg/m   BP Readings from Last 3 Encounters:  07/21/20 (!) 150/95  04/03/19 (!) 150/90  03/20/19 (!) 170/120

## 2020-07-21 NOTE — ED Triage Notes (Signed)
Patient presents to Urgent Care with complaints of headache, chest discomfort with cough, and body aches since Sunday morning. Patient reports needs a BP med refill as well.Treating symptoms with Tylenol.   Denies fever, abdominal pain.

## 2020-07-22 LAB — SARS CORONAVIRUS 2 (TAT 6-24 HRS): SARS Coronavirus 2: POSITIVE — AB

## 2020-07-23 ENCOUNTER — Telehealth: Payer: Self-pay

## 2020-07-23 NOTE — ED Provider Notes (Signed)
Jerry Kaiser   [id number] 07/21/20 Arrival Time: 1696  ASSESSMENT & PLAN:  1. Viral URI with cough   2. Essential hypertension   3. Elevated blood pressure reading in office with diagnosis of hypertension     COVID-19 testing sent. See letter/work note on file for self-isolation guidelines. OTC symptom care as needed.  Refilled BP meds at request.  Meds ordered this encounter  Medications  . amLODipine (NORVASC) 5 MG tablet    Sig: Take 1 tablet (5 mg total) by mouth daily.    Dispense:  30 tablet    Refill:  1    Appt/labs needed for future refills  . lisinopril (ZESTRIL) 40 MG tablet    Sig: Take 1 tablet (40 mg total) by mouth daily.    Dispense:  30 tablet    Refill:  1    Last refill-patient needs an appt since last visit was over 1 yr ago  . benzonatate (TESSALON) 100 MG capsule    Sig: Take 1 capsule by mouth every 8 (eight) hours for cough.    Dispense:  21 capsule    Refill:  0     Follow-up Information    Schedule an appointment as soon as possible for a visit  with Jerry Peng, NP.   Specialty: Family Medicine Contact information: [address] [place] [address] [phone]                Discharge Instructions      You have been tested for COVID-19 today. If your test returns positive, you will receive a phone call from [facility] regarding your results. Negative test results are not called. Both positive and negative results area always visible on MyChart. If you do not have a MyChart account, sign up instructions are provided in your discharge papers. Please do not hesitate to contact us should you have questions or concerns.  Your blood pressure was noted to be elevated during your visit today. If you are currently taking medication for high blood pressure, please ensure you are taking this as directed. If you do not have a history of high blood pressure and your blood pressure remains persistently elevated, you  may need to begin taking a medication at some point. You may return here within the next few days to recheck if unable to see your primary care provider or if you do not have a one.  BP (!) 150/95 (BP Location: Right Arm)   Pulse 68   Temp 97.7 F (36.5 C) (Temporal)   Resp 16   Wt 115.7 kg   SpO2 99%   BMI 33.64 kg/m   BP Readings from Last 3 Encounters:  07/21/20 (!) 150/95  04/03/19 (!) 150/90  03/20/19 (!) 170/120        Reviewed expectations re: course of current medical issues. Questions answered. Outlined signs and symptoms indicating need for more acute intervention. Understanding verbalized. After Visit Summary given.   SUBJECTIVE: History from: patient. Jerry Kaiser is a 55 y.o. male who presents with worries regarding COVID-19. Known COVID-19 contact: none. Recent travel: none. Reports: HA, cough, body aches; x 1 d. Denies: fever and difficulty breathing. Normal PO intake without n/v/d.  Increased blood pressure noted today. Reports that he has been treated for hypertension in the past. Needs refills. He reports no chest pain on exertion, no dyspnea on exertion, no swelling of ankles, no orthostatic dizziness or lightheadedness, no orthopnea or paroxysmal nocturnal dyspnea and no palpitations.  OBJECTIVE:  Vitals:   07/21/20 1344 07/21/20 1345 07/21/20 1348  BP:  (S) (!) 154/97 (!) 150/95  Pulse:  68   Resp:  16   Temp:  97.7 F (36.5 C)   TempSrc:  Temporal   SpO2:  99%   Weight: 115.7 kg      General appearance: alert; no distress Eyes: PERRLA; EOMI; conjunctiva normal HENT: [place]; AT; with nasal congestion Neck: supple  Lungs: speaks full sentences without difficulty; unlabored Extremities: no edema Skin: warm and dry Neurologic: normal gait Psychological: alert and cooperative; normal mood and affect   No Known Allergies  Past Medical History:  Diagnosis Date  . Hypertension    Social History   Socioeconomic History  . Marital  status: Single    Spouse name: Not on file  . Number of children: Not on file  . Years of education: Not on file  . Highest education level: Not on file  Occupational History  . Not on file  Tobacco Use  . Smoking status: Former Research scientist (life sciences)  . Smokeless tobacco: Never Used  Vaping Use  . Vaping Use: Never used  Substance and Sexual Activity  . Alcohol use: No    Alcohol/week: 0.0 standard drinks  . Drug use: No  . Sexual activity: Yes    Birth control/protection: None  Other Topics Concern  . 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 Heart failure Mother    Past Surgical History:  Procedure Laterality Date  . APPENDECTOMY    . CATARACT EXTRACTION     left     Vanessa Kick, MD 07/23/20 [phone]

## 2020-07-23 NOTE — Telephone Encounter (Signed)
Called to discuss with patient about COVID-19 symptoms and the use of one of the available treatments for those with mild to moderate Covid symptoms and at a high risk of hospitalization.  Pt appears to qualify for outpatient treatment due to co-morbid conditions and/or a member of an at-risk group in accordance with the FDA Emergency Use Authorization.    Symptom onset: Unknown Vaccinated: Unknown Booster? Unknown Immunocompromised? No Qualifiers: HTN  Unable to reach pt - Left message and call back number [phone].   Jerry Kaiser   

## 2020-08-27 ENCOUNTER — Other Ambulatory Visit: Payer: Self-pay

## 2020-08-27 ENCOUNTER — Ambulatory Visit (INDEPENDENT_AMBULATORY_CARE_PROVIDER_SITE_OTHER): Payer: 59 | Admitting: Adult Health

## 2020-08-27 ENCOUNTER — Encounter: Payer: Self-pay | Admitting: Adult Health

## 2020-08-27 DIAGNOSIS — I10 Essential (primary) hypertension: Secondary | ICD-10-CM | POA: Diagnosis not present

## 2020-08-27 MED ORDER — LISINOPRIL 40 MG PO TABS: 40.0000 mg | ORAL_TABLET | Freq: Every day | ORAL | 0 refills | Status: DC

## 2020-08-27 MED ORDER — AMLODIPINE BESYLATE 5 MG PO TABS
5.0000 mg | ORAL_TABLET | Freq: Every day | ORAL | 0 refills | Status: DC
Start: 2020-08-27 — End: 2020-10-15

## 2020-08-27 NOTE — Patient Instructions (Signed)
It was great seeing you today   I have sent in your blood pressure medications   Please schedule a physical in one month

## 2020-08-27 NOTE — Progress Notes (Signed)
Subjective:    Patient ID: Jerry Kaiser, male    DOB: 03-16-1966, 55 y.o.   MRN: [id number]  HPI 55 year old male who  has a past medical history of Hypertension.  He has not been seen in the office since 03/2019.   He was seen in the ER on 07/21/2020 for a viral URI. He was noted to have elevated blood pressure readings while in the ER. He was out of medication at this time. Lisinopril 40 mg and Norvasc 5 mg was sent to the pharmacy for 30 days. He has currently been out of his medication for for two weeks. He had been checking his blood pressure at home when he was taking his medications and reports 130's/80's. He denies headaches, blurred vision, CP, or SOB   Review of Systems See HPI   Past Medical History:  Diagnosis Date  . Hypertension     Social History   Socioeconomic History  . Marital status: Single    Spouse name: Not on file  . Number of children: Not on file  . Years of education: Not on file  . Highest education level: Not on file  Occupational History  . Not on file  Tobacco Use  . Smoking status: Former Research scientist (life sciences)  . Smokeless tobacco: Never Used  Vaping Use  . Vaping Use: Never used  Substance and Sexual Activity  . Alcohol use: No    Alcohol/week: 0.0 standard drinks  . Drug use: No  . Sexual activity: Yes    Birth control/protection: None  Other Topics Concern  . 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 APPENDECTOMY    . CATARACT EXTRACTION     left    Family History  Problem Relation Age of Onset  . Heart failure Mother     No Known Allergies  Current  Outpatient Medications on File Prior to Visit  Medication Sig Dispense Refill  . amLODipine (NORVASC) 5 MG tablet Take 1 tablet (5 mg total) by mouth daily. 30 tablet 1  . benzonatate (TESSALON) 100 MG capsule Take 1 capsule by mouth every 8 (eight) hours for cough. 21 capsule 0  . lisinopril (ZESTRIL) 40 MG tablet Take 1 tablet (40 mg total) by mouth daily. 30 tablet 1   No current facility-administered medications on file prior to visit.    BP (!) 150/100 (BP Location: Left Arm, Patient Position: Sitting, Cuff Size: Large)   Pulse 82   Temp 98.4 F (36.9 C) (Oral)   Wt 244 lb 9.6 oz (110.9 kg)   SpO2 97%   BMI 32.27 kg/m       Objective:   Physical Exam Vitals and nursing note reviewed.  Constitutional:      Appearance: Normal appearance.  Cardiovascular:     Rate and Rhythm: Normal rate and regular rhythm.     Pulses: Normal pulses.     Heart sounds: Normal heart sounds.  Pulmonary:     Effort: Pulmonary effort is normal.     Breath sounds: Normal breath  sounds.  Musculoskeletal:        General: Normal range of motion.  Skin:    General: Skin is warm and dry.  Neurological:     General: No focal deficit present.     Mental Status: He is alert and oriented to person, place, and time.       Assessment & Plan:  1. Essential hypertension - Will refill his meds  - Follow up in one month for CPE and BP recheck  - amLODipine (NORVASC) 5 MG tablet; Take 1 tablet (5 mg total) by mouth daily.  Dispense: 90 tablet; Refill: 0 - lisinopril (ZESTRIL) 40 MG tablet; Take 1 tablet (40 mg total) by mouth daily.  Dispense: 90 tablet; Refill: 0   [employer]

## 2020-09-29 ENCOUNTER — Other Ambulatory Visit: Payer: Self-pay

## 2020-09-30 ENCOUNTER — Encounter: Payer: Self-pay | Admitting: Adult Health

## 2020-09-30 ENCOUNTER — Ambulatory Visit (INDEPENDENT_AMBULATORY_CARE_PROVIDER_SITE_OTHER): Payer: 59 | Admitting: Adult Health

## 2020-09-30 ENCOUNTER — Other Ambulatory Visit: Payer: 59

## 2020-09-30 ENCOUNTER — Ambulatory Visit (INDEPENDENT_AMBULATORY_CARE_PROVIDER_SITE_OTHER): Payer: 59

## 2020-09-30 VITALS — BP 142/100 | HR 76 | Temp 97.8°F | Wt 246.2 lb

## 2020-09-30 DIAGNOSIS — I10 Essential (primary) hypertension: Secondary | ICD-10-CM | POA: Diagnosis not present

## 2020-09-30 DIAGNOSIS — Z0000 Encounter for general adult medical examination without abnormal findings: Secondary | ICD-10-CM | POA: Diagnosis not present

## 2020-09-30 DIAGNOSIS — Z125 Encounter for screening for malignant neoplasm of prostate: Secondary | ICD-10-CM

## 2020-09-30 DIAGNOSIS — M25561 Pain in right knee: Secondary | ICD-10-CM

## 2020-09-30 DIAGNOSIS — Z1159 Encounter for screening for other viral diseases: Secondary | ICD-10-CM

## 2020-09-30 DIAGNOSIS — Z1211 Encounter for screening for malignant neoplasm of colon: Secondary | ICD-10-CM

## 2020-09-30 LAB — TSH: TSH: 1.19 u[IU]/mL (ref 0.35–4.50)

## 2020-09-30 LAB — COMPREHENSIVE METABOLIC PANEL
ALT: 19 U/L (ref 0–53)
AST: 21 U/L (ref 0–37)
Albumin: 4.3 g/dL (ref 3.5–5.2)
Alkaline Phosphatase: 75 U/L (ref 39–117)
BUN: 11 mg/dL (ref 6–23)
CO2: 27 mEq/L (ref 19–32)
Calcium: 9.6 mg/dL (ref 8.4–10.5)
Chloride: 104 mEq/L (ref 96–112)
Creatinine, Ser: 1.26 mg/dL (ref 0.40–1.50)
GFR: 64.68 mL/min (ref >60.00)
Glucose, Bld: 86 mg/dL (ref 70–99)
Potassium: 4.3 mEq/L (ref 3.5–5.1)
Sodium: 139 mEq/L (ref 135–145)
Total Bilirubin: 0.5 mg/dL (ref 0.2–1.2)
Total Protein: 7.6 g/dL (ref 6.0–8.3)

## 2020-09-30 LAB — CBC WITH DIFFERENTIAL/PLATELET
Basophils Absolute: 0 10*3/uL (ref 0.0–0.1)
Basophils Relative: 1.1 % (ref 0.0–3.0)
Eosinophils Absolute: 0 10*3/uL (ref 0.0–0.7)
Eosinophils Relative: 0.9 % (ref 0.0–5.0)
HCT: 41.2 % (ref 39.0–52.0)
Hemoglobin: 13.4 g/dL (ref 13.0–17.0)
Lymphocytes Relative: 46.9 % — ABNORMAL HIGH (ref 12.0–46.0)
Lymphs Abs: 1.4 10*3/uL (ref 0.7–4.0)
MCHC: 32.4 g/dL (ref 30.0–36.0)
MCV: 83.7 fl (ref 78.0–100.0)
Monocytes Absolute: 0.2 10*3/uL (ref 0.1–1.0)
Monocytes Relative: 7.6 % (ref 3.0–12.0)
Neutro Abs: 1.3 10*3/uL — ABNORMAL LOW (ref 1.4–7.7)
Neutrophils Relative %: 43.5 % (ref 43.0–77.0)
Platelets: 152 10*3/uL (ref 150.0–400.0)
RBC: 4.93 Mil/uL (ref 4.22–5.81)
RDW: 13 % (ref 11.5–15.5)
WBC: 3 10*3/uL — ABNORMAL LOW (ref 4.0–10.5)

## 2020-09-30 LAB — HEMOGLOBIN A1C: Hgb A1c MFr Bld: 5 % (ref 4.6–6.5)

## 2020-09-30 LAB — LIPID PANEL
Cholesterol: 169 mg/dL (ref 0–200)
HDL: 37.3 mg/dL — ABNORMAL LOW (ref >39.00)
LDL Cholesterol: 107 mg/dL — ABNORMAL HIGH (ref 0–99)
NonHDL: 132.1
Total CHOL/HDL Ratio: 5
Triglycerides: 125 mg/dL (ref 0.0–149.0)
VLDL: 25 mg/dL (ref 0.0–40.0)

## 2020-09-30 LAB — PSA: PSA: 1.79 ng/mL (ref 0.10–4.00)

## 2020-09-30 MED ORDER — AMLODIPINE BESYLATE 10 MG PO TABS: 10.0000 mg | ORAL_TABLET | Freq: Every day | ORAL | 3 refills | Status: DC

## 2020-09-30 NOTE — Patient Instructions (Signed)
It was great seeing you today   I am going to do some blood work and get an xray of your right knee today - I will follow up with you regarding the results   I am also going to increase your Norvasc form 5 mg to 10 mg - you can take two of what you have at home   Someone will call you to schedule your colonoscopy

## 2020-09-30 NOTE — Addendum Note (Signed)
Addended by: Elmer Picker on: 09/30/2020 08:59 AM   Modules accepted: Orders

## 2020-09-30 NOTE — Progress Notes (Signed)
Subjective:    Patient ID: Jerry Kaiser, male    DOB: [birth year], 55 y.o.   MRN: [id number]  HPI Patient presents for yearly preventative medicine examination. He is a pleasant 55 year old male who  has a past medical history of Hypertension.  Hypertension -he was seen 1 month ago after a 2-year absence for elevated blood pressure while he was seen in the emergency room for a viral URI.  He was out of his medication at this time.  Was placed back on lisinopril 40 mg and Norvasc 5 mg. He has not been checking his blood pressure at home.  BP Readings from Last 3 Encounters:  09/30/20 (!) 142/100  08/27/20 (!) 150/100  07/21/20 (!) 150/95   Right knee pain - has been present for 2-3 weeks. Worse in the morning and feels as an ache and stiffness. As he gets up and starts walking the stiffness goes away but aching pain does not. Has not been using anything over the counter. 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 He has not had any swelling, redness, warmth, or clicking sensation.   All immunizations and health maintenance protocols were reviewed with the patient and needed orders were placed.  Appropriate screening laboratory values were ordered for the patient including screening of hyperlipidemia, renal function and hepatic function. If indicated by BPH, a PSA was ordered.  Medication reconciliation,  past medical history, social history, problem list and allergies were reviewed in detail with the patient  Goals were established with regard to weight loss, exercise, and  diet in compliance with medications. He has been working on lifestyle modification and eating a lot of baked foods, no pork or fried foods.   Wt Readings from Last 3 Encounters:  09/30/20 246 lb 3.2 oz (111.7 kg)  08/27/20 244 lb 9.6 oz (110.9 kg)  07/21/20 255 lb (115.7 kg)   He is due for a colonoscopy    Review of Systems  Constitutional: Negative.   HENT: Negative.   Eyes: Negative.   Respiratory:  Negative.   Cardiovascular: Negative.   Gastrointestinal: Negative.   Endocrine: Negative.   Genitourinary: Negative.   Musculoskeletal: Positive for arthralgias. Negative for joint swelling.  Skin: Negative.   Allergic/Immunologic: Negative.   Neurological: Negative.   Hematological: Negative.   Psychiatric/Behavioral: Negative.   All other systems reviewed and are negative.  Past Medical History:  Diagnosis Date  . Hypertension     Social History   Socioeconomic History  . Marital status: Single    Spouse name: Not on file  . Number of children: Not on file  . Years of education: Not on file  . Highest education level: Not on file  Occupational History  . Not on file  Tobacco Use  . Smoking status: Former Research scientist (life sciences)  . Smokeless tobacco: Never Used  Vaping Use  . Vaping Use: Never used  Substance and Sexual Activity  . Alcohol use: No    Alcohol/week: 0.0 standard drinks  . Drug use: No  . Sexual activity: Yes    Birth control/protection: None  Other Topics Concern  . 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 APPENDECTOMY    . CATARACT EXTRACTION     left    Family History  Problem Relation Age of Onset  . Heart failure Mother     No Known Allergies  Current Outpatient Medications on File Prior to Visit  Medication Sig Dispense Refill  . amLODipine (NORVASC) 5 MG tablet Take 1 tablet (5 mg total) by mouth daily. 90 tablet 0  . benzonatate (TESSALON) 100 MG capsule Take 1 capsule by mouth every 8 (eight) hours for cough. 21 capsule 0  . lisinopril (ZESTRIL) 40 MG tablet Take  1 tablet (40 mg total) by mouth daily. 90 tablet 0   No current facility-administered medications on file prior to visit.    BP (!) 142/100 (BP Location: Right Arm, Patient Position: Sitting, Cuff Size: Normal)   Pulse 76   Temp 97.8 F (36.6 C) (Oral)   Wt 246 lb 3.2 oz (111.7 kg)   SpO2 98%   BMI 32.48 kg/m       Objective:   Physical Exam Vitals and nursing note reviewed.  Constitutional:      General: He is not in acute distress.    Appearance: Normal appearance. He is well-developed and normal weight.  HENT:     Head: Normocephalic and atraumatic.     Right Ear: Tympanic membrane, ear canal and external ear normal. There is no impacted cerumen.     Left Ear: Tympanic membrane, ear canal and external ear normal. There is no impacted cerumen.     Nose: Nose normal. No congestion or rhinorrhea.     Mouth/Throat:     Mouth: Mucous membranes are moist.     Pharynx: Oropharynx is clear. No oropharyngeal exudate or posterior oropharyngeal erythema.  Eyes:     General:        Right eye: No discharge.        Left eye: No discharge.     Extraocular Movements: Extraocular movements intact.     Conjunctiva/sclera: Conjunctivae normal.     Pupils: Pupils are equal, round, and reactive to light.  Neck:     Vascular: No carotid bruit.     Trachea: No tracheal deviation.  Cardiovascular:     Rate and Rhythm: Normal rate and regular rhythm.     Pulses: Normal pulses.     Heart sounds: Normal heart sounds. No murmur heard. No friction rub. No gallop.   Pulmonary:     Effort: Pulmonary effort is normal. No respiratory distress.     Breath sounds: Normal breath sounds. No stridor. No wheezing, rhonchi or rales.  Chest:     Chest wall: No tenderness.  Abdominal:     General: Bowel sounds are normal. There is no distension.     Palpations: Abdomen is soft. There is no mass.     Tenderness: There is no abdominal tenderness. There is no right CVA tenderness, left CVA tenderness,  guarding or rebound.     Hernia: No hernia is present.  Musculoskeletal:        General: No swelling, tenderness, deformity or signs of injury. Normal range of motion.     Right knee: Bony tenderness present. No swelling, deformity, erythema or crepitus. Normal range of motion. No tenderness. No LCL laxity, MCL laxity or ACL laxity. Normal meniscus and normal patellar mobility. Normal pulse.     Instability Tests: Anterior  drawer test negative. Posterior drawer test negative. Anterior Lachman test negative. Medial McMurray test negative and lateral McMurray test negative.     Left knee: Normal.     Right lower leg: No edema.     Left lower leg: No edema.  Lymphadenopathy:     Cervical: No cervical adenopathy.  Skin:    General: Skin is warm and dry.     Capillary Refill: Capillary refill takes less than 2 seconds.     Coloration: Skin is not jaundiced or pale.     Findings: No bruising, erythema, lesion or rash.  Neurological:     General: No focal deficit present.     Mental Status: He is alert and oriented to person, place, and time.     Cranial Nerves: No cranial nerve deficit.     Sensory: No sensory deficit.     Motor: No weakness.     Coordination: Coordination normal.     Gait: Gait normal.     Deep Tendon Reflexes: Reflexes normal.  Psychiatric:        Mood and Affect: Mood normal.        Behavior: Behavior normal.        Thought Content: Thought content normal.        Judgment: Judgment normal.       Assessment & Plan:  1. Routine general medical examination at a health care facility - Follow up in one year or sooner if needed - CBC with Differential/Platelet; Future - Comprehensive metabolic panel; Future - Hemoglobin A1c; Future - Lipid panel; Future - TSH; Future  2. Essential hypertension - Will increase Norvasc from 5 mg to 10 mg for tighter BP control.  - Continue with lifestyle modifications  - CBC with Differential/Platelet; Future - Comprehensive  metabolic panel; Future - Hemoglobin A1c; Future - Lipid panel; Future - TSH; Future - amLODipine (NORVASC) 10 MG tablet; Take 1 tablet (10 mg total) by mouth daily.  Dispense: 90 tablet; Refill: 3  3. Prostate cancer screening  - PSA; Future  4. Colon cancer screening  - Ambulatory referral to Gastroenterology  5. Need for hepatitis C screening test  - Hep C Antibody; Future  6. Acute pain of right knee - Likely arthritis. No concern for soft tissue injury  - DG Knee 1-2 Views Right; Future    Dorothyann Peng, NP

## 2020-10-01 LAB — HEPATITIS C ANTIBODY
Hepatitis C Ab: NONREACTIVE
SIGNAL TO CUT-OFF: 0.01 (ref <1.00)

## 2020-10-15 ENCOUNTER — Other Ambulatory Visit: Payer: Self-pay

## 2020-10-15 ENCOUNTER — Ambulatory Visit (INDEPENDENT_AMBULATORY_CARE_PROVIDER_SITE_OTHER): Payer: 59 | Admitting: Adult Health

## 2020-10-15 ENCOUNTER — Encounter: Payer: Self-pay | Admitting: Adult Health

## 2020-10-15 VITALS — BP 130/84 | HR 73 | Temp 98.4°F | Wt 247.6 lb

## 2020-10-15 DIAGNOSIS — M1711 Unilateral primary osteoarthritis, right knee: Secondary | ICD-10-CM | POA: Diagnosis not present

## 2020-10-15 DIAGNOSIS — I10 Essential (primary) hypertension: Secondary | ICD-10-CM | POA: Diagnosis not present

## 2020-10-15 MED ORDER — LISINOPRIL 40 MG PO TABS: 40.0000 mg | ORAL_TABLET | Freq: Every day | ORAL | 3 refills | Status: DC

## 2020-10-15 MED ORDER — METHYLPREDNISOLONE ACETATE 80 MG/ML IJ SUSP
80.0000 mg | Freq: Once | INTRAMUSCULAR | Status: AC
  Administered 2020-10-15: 80 mg via INTRA_ARTICULAR

## 2020-10-15 NOTE — Progress Notes (Signed)
Subjective:    Patient ID: Jerry Kaiser, male    DOB: Nov 18, 1965, 55 y.o.   MRN: [id number]  HPI 55 year old male who  has a past medical history of Hypertension.  He presents to the office today for follow-up regarding hypertension.  Maintained on lisinopril 40 mg Norvasc 10 mg.  He was seen approximately 2 weeks ago for his CPE his blood pressure was not at goal and Norvasc was increased from 5 to 10 mg.  He reports that since the increase of Norvasc he denies dizziness, lightheadedness, blurred vision or headaches. Has not had any swelling in his legs with the increased dose. He has been monitoring at home and reports readings in the 130's/80'90.   BP Readings from Last 3 Encounters:  10/15/20 130/84  09/30/20 (!) 142/100  08/27/20 (!) 150/100   He would also like to have a steroid injection in his right knee. His xray showed 'Mild patellofemoral joint space compartment degenerative changes. 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   Review of Systems See HPI   Past Medical History:  Diagnosis Date  . Hypertension     Social History   Socioeconomic History  . Marital status: Single    Spouse name: Not on file  . Number of children: Not on file  . Years of education: Not on file  . Highest education level: Not on file  Occupational History  . Not on file  Tobacco Use  . Smoking status: Former Research scientist (life sciences)  . Smokeless tobacco: Never Used  Vaping Use  . Vaping Use: Never used  Substance and Sexual Activity  . Alcohol use: No    Alcohol/week: 0.0 standard drinks  . Drug use: No  . Sexual activity: Yes    Birth control/protection: None  Other Topics Concern  . 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 APPENDECTOMY    . CATARACT EXTRACTION     left    Family History  Problem Relation Age of Onset  . Heart failure Mother     No Known Allergies  Current Outpatient Medications on File Prior to Visit  Medication Sig Dispense Refill  . amLODipine (NORVASC) 10 MG tablet Take 1 tablet (10 mg total) by mouth daily. 90 tablet 3   No current facility-administered medications on file prior to visit.    BP 130/84 (BP Location: Left Arm, Patient Position: Sitting, Cuff Size: Large)   Pulse 73   Temp 98.4 F (36.9 C) (Oral)   Wt 247 lb 9.6 oz (112.3 kg)   SpO2 97%   BMI 32.67 kg/m       Objective:   Physical Exam Vitals and nursing note reviewed.  Constitutional:      Appearance: Normal appearance.  Cardiovascular:     Rate and Rhythm: Normal rate and regular rhythm.     Pulses: Normal pulses.     Heart sounds: Normal heart sounds.  Pulmonary:  Effort: Pulmonary effort is normal.     Breath sounds: Normal breath sounds.  Musculoskeletal:        General: Normal range of motion.     Right knee: Bony tenderness present.  Skin:    General: Skin is warm and dry.  Neurological:     Mental Status: He is alert.  Psychiatric:        Mood and Affect: Mood normal.        Behavior: Behavior normal.        Thought Content: Thought content normal.        Judgment: Judgment normal.       Assessment & Plan:  1. Essential hypertension - BP improving.  - Advised to continue to monitor over the next 2-4 weeks. Follow up if not at goal  - lisinopril (ZESTRIL) 40 MG tablet; Take 1 tablet (40 mg total) by mouth daily.  Dispense: 90 tablet; Refill: 3  2. Localized osteoarthritis of right knee Discussed risks and benefits of corticosteroid injection and patient  consented.  After prepping skin with betadine, injected 80 mg depomedrol and 2 cc of plain xylocaine with 22 gauge one and one half inch needle using anterolateral approach and pt tolerated well. - methylPREDNISolone acetate (DEPO-MEDROL) injection 80 mg - Follow up as needed  Dorothyann Peng, NP

## 2020-11-20 ENCOUNTER — Other Ambulatory Visit: Payer: Self-pay | Admitting: Adult Health

## 2020-11-20 DIAGNOSIS — I10 Essential (primary) hypertension: Secondary | ICD-10-CM

## 2021-01-08 ENCOUNTER — Encounter: Payer: Self-pay | Admitting: Nurse Practitioner

## 2021-02-10 ENCOUNTER — Encounter: Payer: Self-pay | Admitting: Nurse Practitioner

## 2021-02-10 ENCOUNTER — Ambulatory Visit (INDEPENDENT_AMBULATORY_CARE_PROVIDER_SITE_OTHER): Payer: 59 | Admitting: Nurse Practitioner

## 2021-02-10 VITALS — BP 142/88 | HR 76 | Ht 73.5 in | Wt 241.0 lb

## 2021-02-10 DIAGNOSIS — Z1211 Encounter for screening for malignant neoplasm of colon: Secondary | ICD-10-CM

## 2021-02-10 NOTE — Progress Notes (Signed)
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  Jerry Kaiser presents to our office today as referred by Sallee Provencal NP to schedule a screening colonoscopy.  Jerry Kaiser denies having any upper or lower abdominal pain.  Jerry Kaiser is passing normal formed brown bowel movement once daily.  No rectal bleeding or black-colored stools.  Jerry Kaiser reported undergoing a colonoscopy 4 years ago by gastroenterologist at [facility] which was normal.  Jerry Kaiser cannot recall the name of the gastroenterologist who performed his colonoscopy.   CBC Latest Ref Rng & Units 09/30/2020 08/26/2015 09/26/2007  WBC 4.0 - 10.5 K/uL 3.0(L) 4.1 -  Hemoglobin 13.0 - 17.0 g/dL 13.4 13.5 14.3  Hematocrit 39.0 - 52.0 % 41.2 41.5 42.0  Platelets 150.0 - 400.0 K/uL 152.0 199.0 -    CMP Latest Ref Rng & Units 09/30/2020 02/14/2019 08/26/2015  Glucose 70 - 99 mg/dL 86 100(H) 81  BUN 6 - 23 mg/dL '11 13 11  '$ Creatinine 0.40 - 1.50 mg/dL 1.26 1.43 1.15  Sodium 135 - 145 mEq/L 139 142 140  Potassium 3.5 - 5.1 mEq/L 4.3 4.0 3.8  Chloride 96 - 112 mEq/L 104 105 103  CO2 19 - 32 mEq/L '27 29 26  '$ Calcium 8.4 - 10.5 mg/dL 9.6 9.7 9.7  Total Protein 6.0 - 8.3 g/dL 7.6 - 7.7  Total Bilirubin 0.2 - 1.2 mg/dL 0.5 - 0.5  Alkaline Phos 39 - 117 U/L 75 - 89  AST 0 - 37 U/L 21 - 24  ALT 0 - 53 U/L 19 - 22     Past Medical History:  Diagnosis Date   Hypertension    Past Surgical History:  Procedure Laterality Date   APPENDECTOMY     CATARACT EXTRACTION     left   LIPOMA EXCISION     neck   Social History: Jerry Kaiser is married.  Jerry Kaiser has 1 son and 3 daughters.  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  Past smoker.  No alcohol use.  No drug use.  Family History: Mother with history of heart failure.  No known family history of esophageal,  gastric or colon cancer.  No Known Allergies    Outpatient Encounter Medications as of 02/10/2021  Medication Sig   amLODipine (NORVASC) 10 MG tablet Take 1 tablet (10 mg total) by mouth daily.   lisinopril (ZESTRIL) 40 MG tablet Take 1 tablet (40 mg total) by mouth daily.   No facility-administered encounter medications on file as of 02/10/2021.   REVIEW OF SYSTEMS:  Gen: Denies fever, sweats or chills. No weight loss.  CV: Denies chest pain, palpitations or edema. Resp: Denies cough, shortness of breath of hemoptysis.  GI: Denies heartburn, dysphagia, stomach or lower abdominal pain. No diarrhea or constipation.  GU : Denies urinary burning, blood in urine, increased urinary frequency or incontinence. MS: Denies joint pain, muscles aches or weakness. Derm: Denies rash, itchiness, skin lesions or unhealing ulcers. Psych: Denies depression, anxiety or memory loss. Heme: Denies bruising, bleeding. Neuro:  Denies headaches, dizziness or paresthesias. Endo:  Denies any problems with DM, thyroid or adrenal function.  PHYSICAL EXAM: BP (!) 142/88   Pulse 76   Ht 6' 1.5" (1.867 m)   Wt 241 lb (109.3 kg)   SpO2 99%  BMI 31.36 kg/m   General: 55 year old male in no acute distress. Head: Normocephalic and atraumatic. Eyes:  Left pupil deformity. Sclerae non-icteric, conjunctive pink. Ears: Normal auditory acuity. Mouth: Upper partial plate. No ulcers or lesions.  Neck: Supple, no lymphadenopathy or thyromegaly.  Lungs: Clear bilaterally to auscultation without wheezes, crackles or rhonchi. Heart: Regular rate and rhythm. No murmur, rub or gallop appreciated.  Abdomen: Soft, nontender, non distended. No masses. No hepatosplenomegaly. Normoactive bowel sounds x 4 quadrants.  Rectal: Deferred.  Musculoskeletal: Symmetrical with no gross deformities. Skin: Warm and dry. No rash or lesions on visible extremities. Extremities: No edema. Neurological: Alert oriented x 4, no focal  deficits.  Psychological:  Alert and cooperative. Normal mood and affect.  ASSESSMENT AND PLAN:  65) 55 year old male presents to schedule a screening colonoscopy. Patient reports undergoing a normal colonoscopy by a gastroenterologist in [facility] 4 years ago. I contacted [facility] in [facility], patient was not seen there.  -I asked the patient to further investigate his medical records at home to assist with identifying where and when his colonoscopy was done.  -If his colonoscopy was done < 5 years ago and was normal Jerry Kaiser is not due for a screening colonoscopy at this time and Jerry Kaiser would be due for a colonoscopy 10 years from his initial colonoscopy date. (No family history of colon cancer and Jerry Kaiser is asymptomatic).   -I will call the patient in one week to obtain an update, if we are not able to obtain his colonoscopy records  I would recommend scheduling a colonoscopy in the near future. Colonoscopy benefits and risks discussed including risk with sedation, risk of bleeding, perforation and infection.        CC:  Dorothyann Peng, NP

## 2021-02-10 NOTE — Patient Instructions (Addendum)
If you are age 56 or younger, your body mass index should be between 19-25. Your Body mass index is 31.36 kg/m. If this is out of the aformentioned range listed, please consider follow up with your Primary Care Provider.    Please obtain a copy of your past colonoscopy report from 2017 [facility] so we can review it prior to scheduling a future colonoscopy.  It was great seeing you today! Thank you for entrusting me with your care and choosing [facility]  Noralyn Pick, CRNP  The [facility] [facility] providers would like to encourage you to use [facility] to communicate with providers for non-urgent requests or questions.  Due to long hold times on the telephone, sending your provider a message by [facility] may be faster and more efficient way to get a response. Please allow 48 business hours for a response.  Please remember that this is for non-urgent requests/questions.

## 2021-02-11 NOTE — Progress Notes (Signed)
Agree with the assessment and plan as outlined by Colleen Kennedy-Smith, NP.    Mekala Winger E. Jonathan Kirkendoll, MD [facility]  

## 2021-03-10 ENCOUNTER — Telehealth: Payer: Self-pay | Admitting: Nurse Practitioner

## 2021-03-10 NOTE — Telephone Encounter (Signed)
error 

## 2021-04-20 ENCOUNTER — Telehealth: Payer: Self-pay | Admitting: Nurse Practitioner

## 2021-04-20 NOTE — Telephone Encounter (Signed)
[name], pls contact patient and verify if he was able to locate his past colonoscopy records? Refer to office visit 02/10/2021. Patient previously reported having a colonoscopy 4 years ago??   If he was unable to locate his past colonoscopy records then I would schedule him for a screening colonoscopy with Dr. Candis Schatz. However, patient should check with his insurance carrier to make sure this procedure is covered (may not be covered by insurance if he truly had a colonoscopy 4 yrs ago). Pls let me know outcome. Thx

## 2021-04-21 NOTE — Telephone Encounter (Signed)
Dr. Candis Schatz, you are supervising physician for this patient.  Past colonoscopy records not obtainable at this point.  Pls review the message below and his last office visit 02/10/2021 and let me know what recall colonoscopy date you would recommend for this patient. THX

## 2021-04-21 NOTE — Telephone Encounter (Signed)
He does not have his records or the name of the provider or office. He wants to wait "until the next round" and not do the procedure yet.  His insurance is different from the insurance he previously had. They do not know when he had a colonoscopy last.

## 2021-04-22 NOTE — Telephone Encounter (Signed)
Patient contact with this plan of care. He understands he can call when he is ready to schedule. We will reach out to him in 1 year if he does not call us before that time. Recall entered with this information.

## 2021-04-22 NOTE — Telephone Encounter (Signed)
[name], pls enter recall colonoscopy in 1 yr thx

## 2021-09-27 ENCOUNTER — Other Ambulatory Visit: Payer: Self-pay | Admitting: Adult Health

## 2021-09-27 DIAGNOSIS — I10 Essential (primary) hypertension: Secondary | ICD-10-CM

## 2021-10-28 ENCOUNTER — Other Ambulatory Visit: Payer: Self-pay | Admitting: Adult Health

## 2021-10-28 DIAGNOSIS — I10 Essential (primary) hypertension: Secondary | ICD-10-CM

## 2022-01-25 ENCOUNTER — Telehealth: Payer: Self-pay | Admitting: Adult Health

## 2022-01-25 DIAGNOSIS — I10 Essential (primary) hypertension: Secondary | ICD-10-CM

## 2022-01-25 NOTE — Telephone Encounter (Signed)
Requesting refill of lisinopril (ZESTRIL) 40 MG tablet

## 2022-01-26 MED ORDER — LISINOPRIL 40 MG PO TABS: 40.0000 mg | ORAL_TABLET | Freq: Every day | ORAL | 0 refills | Status: DC

## 2022-01-26 NOTE — Telephone Encounter (Signed)
Refilled pt has been sscheduled for CPE

## 2022-02-04 ENCOUNTER — Ambulatory Visit (INDEPENDENT_AMBULATORY_CARE_PROVIDER_SITE_OTHER): Payer: Commercial Managed Care - HMO | Admitting: Adult Health

## 2022-02-04 ENCOUNTER — Encounter: Payer: Self-pay | Admitting: Adult Health

## 2022-02-04 VITALS — BP 130/84 | HR 65 | Temp 98.5°F | Ht 71.0 in | Wt 252.0 lb

## 2022-02-04 DIAGNOSIS — Z23 Encounter for immunization: Secondary | ICD-10-CM | POA: Diagnosis not present

## 2022-02-04 DIAGNOSIS — Z0000 Encounter for general adult medical examination without abnormal findings: Secondary | ICD-10-CM | POA: Diagnosis not present

## 2022-02-04 DIAGNOSIS — Z125 Encounter for screening for malignant neoplasm of prostate: Secondary | ICD-10-CM | POA: Diagnosis not present

## 2022-02-04 DIAGNOSIS — Z114 Encounter for screening for human immunodeficiency virus [HIV]: Secondary | ICD-10-CM

## 2022-02-04 DIAGNOSIS — I10 Essential (primary) hypertension: Secondary | ICD-10-CM

## 2022-02-04 LAB — CBC WITH DIFFERENTIAL/PLATELET
Basophils Absolute: 0 10*3/uL (ref 0.0–0.1)
Basophils Relative: 0.8 % (ref 0.0–3.0)
Eosinophils Absolute: 0 10*3/uL (ref 0.0–0.7)
Eosinophils Relative: 0.6 % (ref 0.0–5.0)
HCT: 41.1 % (ref 39.0–52.0)
Hemoglobin: 13.4 g/dL (ref 13.0–17.0)
Lymphocytes Relative: 42.9 % (ref 12.0–46.0)
Lymphs Abs: 1.7 10*3/uL (ref 0.7–4.0)
MCHC: 32.5 g/dL (ref 30.0–36.0)
MCV: 84.6 fl (ref 78.0–100.0)
Monocytes Absolute: 0.3 10*3/uL (ref 0.1–1.0)
Monocytes Relative: 7.4 % (ref 3.0–12.0)
Neutro Abs: 2 10*3/uL (ref 1.4–7.7)
Neutrophils Relative %: 48.3 % (ref 43.0–77.0)
Platelets: 152 10*3/uL (ref 150.0–400.0)
RBC: 4.86 Mil/uL (ref 4.22–5.81)
RDW: 12.8 % (ref 11.5–15.5)
WBC: 4.1 10*3/uL (ref 4.0–10.5)

## 2022-02-04 LAB — COMPREHENSIVE METABOLIC PANEL
ALT: 23 U/L (ref 0–53)
AST: 25 U/L (ref 0–37)
Albumin: 4.5 g/dL (ref 3.5–5.2)
Alkaline Phosphatase: 83 U/L (ref 39–117)
BUN: 15 mg/dL (ref 6–23)
CO2: 29 mEq/L (ref 19–32)
Calcium: 9.6 mg/dL (ref 8.4–10.5)
Chloride: 103 mEq/L (ref 96–112)
Creatinine, Ser: 1.37 mg/dL (ref 0.40–1.50)
GFR: 57.95 mL/min — ABNORMAL LOW (ref >60.00)
Glucose, Bld: 83 mg/dL (ref 70–99)
Potassium: 4.6 mEq/L (ref 3.5–5.1)
Sodium: 139 mEq/L (ref 135–145)
Total Bilirubin: 0.6 mg/dL (ref 0.2–1.2)
Total Protein: 7.5 g/dL (ref 6.0–8.3)

## 2022-02-04 LAB — LIPID PANEL
Cholesterol: 178 mg/dL (ref 0–200)
HDL: 37.8 mg/dL — ABNORMAL LOW (ref >39.00)
LDL Cholesterol: 103 mg/dL — ABNORMAL HIGH (ref 0–99)
NonHDL: 140.14
Total CHOL/HDL Ratio: 5
Triglycerides: 184 mg/dL — ABNORMAL HIGH (ref 0.0–149.0)
VLDL: 36.8 mg/dL (ref 0.0–40.0)

## 2022-02-04 LAB — TSH: TSH: 1.02 u[IU]/mL (ref 0.35–5.50)

## 2022-02-04 LAB — HEMOGLOBIN A1C: Hgb A1c MFr Bld: 5.2 % (ref 4.6–6.5)

## 2022-02-04 LAB — PSA: PSA: 1.73 ng/mL (ref 0.10–4.00)

## 2022-02-04 MED ORDER — AMLODIPINE BESYLATE 10 MG PO TABS: 10.0000 mg | ORAL_TABLET | Freq: Every day | ORAL | 3 refills | Status: DC

## 2022-02-04 MED ORDER — LISINOPRIL 40 MG PO TABS: 40.0000 mg | ORAL_TABLET | Freq: Every day | ORAL | 3 refills | Status: DC

## 2022-02-04 NOTE — Progress Notes (Signed)
Subjective:    Patient ID: Jerry Kaiser, male    DOB: [birth year], 56 y.o.   MRN: [id number]  HPI Patient presents for yearly preventative medicine examination. He is a pleasant 56 year old male who  has a past medical history of Hypertension.  Hypertension -maintained on Norvasc 10 mg and lisinopril 40 mg daily.  He does not check his blood pressures at home but denies dizziness, lightheadedness, chest pain, or shortness of breath. His wife does check his blood pressure at home and reports readings in the 120-130's/80 BP Readings from Last 3 Encounters:  02/04/22 130/84  02/10/21 (!) 142/88  10/15/20 130/84   All immunizations and health maintenance protocols were reviewed with the patient and needed orders were placed.  Appropriate screening laboratory values were ordered for the patient including screening of hyperlipidemia, renal function and hepatic function. If indicated by BPH, a PSA was ordered.  Medication reconciliation,  past medical history, social history, problem list and allergies were reviewed in detail with the patient  Goals were established with regard to weight loss, exercise, and  diet in compliance with medications  He had a colonoscopy about 5 year ago at a Gastroenterologist in [facility] but he cannot remember who it was with   Review of Systems  Constitutional: Negative.   HENT: Negative.    Eyes: Negative.   Respiratory: Negative.    Cardiovascular: Negative.   Gastrointestinal: Negative.   Endocrine: Negative.   Genitourinary: Negative.   Musculoskeletal: Negative.   Skin: Negative.   Allergic/Immunologic: Negative.   Neurological: Negative.   Hematological: Negative.   Psychiatric/Behavioral: Negative.    All other systems reviewed and are negative.  Past Medical History:  Diagnosis Date   Hypertension     Social History   Socioeconomic History   Marital status: Single    Spouse name: Not on file   Number of children: Not on file    Years of education: Not on file   Highest education level: Not on file  Occupational History   Not on file  Tobacco Use   Smoking status: Former   Smokeless tobacco: Never  Vaping Use   Vaping Use: Never used  Substance and Sexual Activity   Alcohol use: No    Alcohol/week: 0.0 standard drinks of alcohol   Drug use: No   Sexual activity: Yes    Birth control/protection: None  Other Topics Concern   Not on file  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, 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   Social Determinants of Radio broadcast assistant Strain: Not on file  Food Insecurity: Not on file  Transportation Needs: Not on file  Physical Activity: Not on file  Stress: Not on file  Social Connections: Not on file  Intimate Partner Violence: Not on file    Past Surgical History:  Procedure Laterality Date   APPENDECTOMY     CATARACT EXTRACTION     left   LIPOMA EXCISION     neck    Family History  Problem Relation Age of Onset   Heart failure Mother     No Known Allergies  Current Outpatient Medications on File Prior to Visit  Medication Sig Dispense Refill   amLODipine (NORVASC) 10 MG tablet Take 1 tablet (10 mg total) by mouth daily. SCHEDULE PHYSICAL FOR FUTURE REFILLS 30 tablet 0   lisinopril (ZESTRIL) 40 MG tablet Take 1 tablet (40 mg total) by  mouth daily. 30 tablet 0   No current facility-administered medications on file prior to visit.    BP 130/84 Comment: home  Pulse 65   Temp 98.5 F (36.9 C) (Oral)   Ht '5\' 11"'$  (1.803 m)   Wt 252 lb (114.3 kg)   SpO2 98%   BMI 35.15 kg/m       Objective:   Physical Exam Vitals and nursing note reviewed.  Constitutional:      General: He is not in acute distress.    Appearance: Normal appearance. He is well-developed and normal weight.  HENT:     Head: Normocephalic and atraumatic.     Right Ear: Tympanic membrane, ear canal and external ear normal. There is no impacted cerumen.     Left Ear:  Tympanic membrane, ear canal and external ear normal. There is no impacted cerumen.     Nose: Nose normal. No congestion or rhinorrhea.     Mouth/Throat:     Mouth: Mucous membranes are moist.     Dentition: Abnormal dentition.     Pharynx: Oropharynx is clear. No oropharyngeal exudate or posterior oropharyngeal erythema.  Eyes:     General:        Right eye: No discharge.        Left eye: No discharge.     Extraocular Movements: Extraocular movements intact.     Conjunctiva/sclera: Conjunctivae normal.     Pupils: Pupils are equal, round, and reactive to light.  Neck:     Vascular: No carotid bruit.     Trachea: No tracheal deviation.  Cardiovascular:     Rate and Rhythm: Normal rate and regular rhythm.     Pulses: Normal pulses.     Heart sounds: Normal heart sounds. No murmur heard.    No friction rub. No gallop.  Pulmonary:     Effort: Pulmonary effort is normal. No respiratory distress.     Breath sounds: Normal breath sounds. No stridor. No wheezing, rhonchi or rales.  Chest:     Chest wall: No tenderness.  Abdominal:     General: Bowel sounds are normal. There is no distension.     Palpations: Abdomen is soft. There is no mass.     Tenderness: There is no abdominal tenderness. There is no right CVA tenderness, left CVA tenderness, guarding or rebound.     Hernia: No hernia is present.  Musculoskeletal:        General: No swelling, tenderness, deformity or signs of injury. Normal range of motion.     Right lower leg: No edema.     Left lower leg: No edema.  Lymphadenopathy:     Cervical: No cervical adenopathy.  Skin:    General: Skin is warm and dry.     Capillary Refill: Capillary refill takes less than 2 seconds.     Coloration: Skin is not jaundiced or pale.     Findings: No bruising, erythema, lesion or rash.  Neurological:     General: No focal deficit present.     Mental Status: He is alert and oriented to person, place, and time.     Cranial Nerves: No  cranial nerve deficit.     Sensory: No sensory deficit.     Motor: No weakness.     Coordination: Coordination normal.     Gait: Gait normal.     Deep Tendon Reflexes: Reflexes normal.  Psychiatric:        Mood and Affect: Mood normal.  Behavior: Behavior normal.        Thought Content: Thought content normal.        Judgment: Judgment normal.       Assessment & Plan:   1. Routine general medical examination at a health care facility - Encouraged heart healthy diet and exercise - Follow up in one year or sooner if needed - CBC with Differential/Platelet; Future - Comprehensive metabolic panel; Future - Hemoglobin A1c; Future - Lipid panel; Future - TSH; Future  2. Essential hypertension - No change in medication  - CBC with Differential/Platelet; Future - Comprehensive metabolic panel; Future - Hemoglobin A1c; Future - Lipid panel; Future - lisinopril (ZESTRIL) 40 MG tablet; Take 1 tablet (40 mg total) by mouth daily.  Dispense: 90 tablet; Refill: 3 - amLODipine (NORVASC) 10 MG tablet; Take 1 tablet (10 mg total) by mouth daily. SCHEDULE PHYSICAL FOR FUTURE REFILLS  Dispense: 90 tablet; Refill: 3  3. Prostate cancer screening  - PSA; Future  4. Encounter for screening for HIV  - HIV Antibody (routine testing w rflx); Future  5. Need for shingles vaccine - Follow up in 3 months for next vaccination  - Varicella-zoster vaccine IM  Dorothyann Peng, NP

## 2022-02-04 NOTE — Patient Instructions (Signed)
It was great seeing you today   We will follow up with you regarding your lab work   Please let me know if you need anything   

## 2022-02-05 LAB — HIV ANTIBODY (ROUTINE TESTING W REFLEX): HIV 1&2 Ab, 4th Generation: NONREACTIVE

## 2022-05-07 ENCOUNTER — Ambulatory Visit (INDEPENDENT_AMBULATORY_CARE_PROVIDER_SITE_OTHER): Payer: Commercial Managed Care - HMO

## 2022-05-07 DIAGNOSIS — Z23 Encounter for immunization: Secondary | ICD-10-CM | POA: Diagnosis not present

## 2022-05-12 ENCOUNTER — Encounter: Payer: Self-pay | Admitting: Gastroenterology

## 2022-11-04 DIAGNOSIS — Z8249 Family history of ischemic heart disease and other diseases of the circulatory system: Secondary | ICD-10-CM | POA: Diagnosis not present

## 2022-11-04 DIAGNOSIS — G3184 Mild cognitive impairment, so stated: Secondary | ICD-10-CM | POA: Diagnosis not present

## 2022-11-04 DIAGNOSIS — Z6834 Body mass index (BMI) 34.0-34.9, adult: Secondary | ICD-10-CM | POA: Diagnosis not present

## 2022-11-04 DIAGNOSIS — I10 Essential (primary) hypertension: Secondary | ICD-10-CM | POA: Diagnosis not present

## 2022-11-04 DIAGNOSIS — Z809 Family history of malignant neoplasm, unspecified: Secondary | ICD-10-CM | POA: Diagnosis not present

## 2022-11-04 DIAGNOSIS — E669 Obesity, unspecified: Secondary | ICD-10-CM | POA: Diagnosis not present

## 2022-11-23 ENCOUNTER — Ambulatory Visit (INDEPENDENT_AMBULATORY_CARE_PROVIDER_SITE_OTHER): Payer: 59 | Admitting: Adult Health

## 2022-11-23 ENCOUNTER — Encounter: Payer: Self-pay | Admitting: Adult Health

## 2022-11-23 VITALS — BP 130/88 | HR 79 | Temp 98.3°F | Ht 71.0 in | Wt 253.0 lb

## 2022-11-23 DIAGNOSIS — M25561 Pain in right knee: Secondary | ICD-10-CM | POA: Diagnosis not present

## 2022-11-23 MED ORDER — METHYLPREDNISOLONE ACETATE 80 MG/ML IJ SUSP
80.0000 mg | Freq: Once | INTRAMUSCULAR | Status: AC
  Administered 2022-11-23: 80 mg via INTRA_ARTICULAR

## 2022-11-23 NOTE — Progress Notes (Signed)
Subjective:    Patient ID: Jerry Kaiser, male    DOB: [birth year], 57 y.o.   MRN: [id number]  Knee Pain    57 year old male who  has a past medical history of Hypertension.  He presents to the office today for right knee pain. Pain has been present for the last week or so. Denies trauma or aggrivating injury. Bending and crouching cause worsening pain. He has been using Advil which helps alleviate the pain. He does feel pain from inside the knee and on the medial aspect.  He has not noticed any swelling, redness, warmth or numbness.   He had similar pain in his left knee in 2022 and had a steroid injection. He has not had any pain since.  Review of Systems See HPI   Past Medical History:  Diagnosis Date   Hypertension     Social History   Socioeconomic History   Marital status: Single    Spouse name: Not on file   Number of children: Not on file   Years of education: Not on file   Highest education level: Not on file  Occupational History   Not on file  Tobacco Use   Smoking status: Former   Smokeless tobacco: Never  Vaping Use   Vaping Use: Never used  Substance and Sexual Activity   Alcohol use: No    Alcohol/week: 0.0 standard drinks of alcohol   Drug use: No   Sexual activity: Yes    Birth control/protection: None  Other Topics Concern   Not on file  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, 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   Social Determinants of Corporate investment banker Strain: Not on file  Food Insecurity: Not on file  Transportation Needs: Not on file  Physical Activity: Not on file  Stress: Not on file  Social Connections: Not on file  Intimate Partner Violence: Not on file    Past Surgical History:  Procedure Laterality Date   APPENDECTOMY     CATARACT EXTRACTION     left   LIPOMA EXCISION     neck    Family History  Problem Relation Age of Onset   Heart failure Mother     No Known Allergies  Current Outpatient  Medications on File Prior to Visit  Medication Sig Dispense Refill   amLODipine (NORVASC) 10 MG tablet Take 1 tablet (10 mg total) by mouth daily. SCHEDULE PHYSICAL FOR FUTURE REFILLS 90 tablet 3   lisinopril (ZESTRIL) 40 MG tablet Take 1 tablet (40 mg total) by mouth daily. 90 tablet 3   No current facility-administered medications on file prior to visit.    BP 130/88   Pulse 79   Temp 98.3 F (36.8 C) (Oral)   Ht 5\' 11"  (1.803 m)   Wt 253 lb (114.8 kg)   SpO2 99%   BMI 35.29 kg/m         Objective:   Physical Exam Vitals and nursing note reviewed.  Constitutional:      Appearance: Normal appearance.  Musculoskeletal:        General: Tenderness present.     Right knee: Bony tenderness and crepitus present. No swelling, effusion or erythema. Normal range of motion. Tenderness present over the medial joint line. No LCL laxity, MCL laxity, ACL laxity or PCL laxity. Normal alignment, normal meniscus and normal patellar mobility. Normal pulse.  Skin:    General: Skin  is warm and dry.  Neurological:     General: No focal deficit present.     Mental Status: He is alert and oriented to person, place, and time.  Psychiatric:        Mood and Affect: Mood normal.        Behavior: Behavior normal.        Thought Content: Thought content normal.        Judgment: Judgment normal.       Assessment & Plan:  1. Acute pain of right knee - We discussed treatments including continued use of NSAIDS, rest, ice and steroid injection. Since he did well with previous injections he would like to proceed with that    Joint Injection/Arthrocentesis  Date/Time: 11/23/2022 3:54 PM  Performed by: Shirline Frees, NP Authorized by: Shirline Frees, NP  Indications: pain  Body area: knee Joint: right knee Local anesthesia used: yes  Anesthesia: Local anesthesia used: yes Local Anesthetic: topical anesthetic and lidocaine 2% without epinephrine  Sedation: Patient sedated: no  Needle size:  20 G Ultrasound guidance: no Approach: posterior Methylprednisolone amount: 80 mg Lidocaine 1% amount: 2 mL Patient tolerance: patient tolerated the procedure well with no immediate complications     - methylPREDNISolone acetate (DEPO-MEDROL) injection 80 mg

## 2023-01-20 ENCOUNTER — Other Ambulatory Visit: Payer: Self-pay | Admitting: Adult Health

## 2023-01-20 DIAGNOSIS — I10 Essential (primary) hypertension: Secondary | ICD-10-CM

## 2023-02-08 ENCOUNTER — Encounter: Payer: Self-pay | Admitting: Adult Health

## 2023-02-08 ENCOUNTER — Ambulatory Visit (INDEPENDENT_AMBULATORY_CARE_PROVIDER_SITE_OTHER): Payer: 59 | Admitting: Adult Health

## 2023-02-08 ENCOUNTER — Ambulatory Visit (INDEPENDENT_AMBULATORY_CARE_PROVIDER_SITE_OTHER): Payer: 59

## 2023-02-08 VITALS — BP 120/90 | HR 90 | Temp 98.1°F | Ht 71.0 in | Wt 254.0 lb

## 2023-02-08 DIAGNOSIS — Z0000 Encounter for general adult medical examination without abnormal findings: Secondary | ICD-10-CM | POA: Diagnosis not present

## 2023-02-08 DIAGNOSIS — Z125 Encounter for screening for malignant neoplasm of prostate: Secondary | ICD-10-CM

## 2023-02-08 DIAGNOSIS — M25561 Pain in right knee: Secondary | ICD-10-CM

## 2023-02-08 DIAGNOSIS — I10 Essential (primary) hypertension: Secondary | ICD-10-CM

## 2023-02-08 DIAGNOSIS — M25461 Effusion, right knee: Secondary | ICD-10-CM | POA: Diagnosis not present

## 2023-02-08 LAB — COMPREHENSIVE METABOLIC PANEL
ALT: 21 U/L (ref 0–53)
AST: 24 U/L (ref 0–37)
Albumin: 4.6 g/dL (ref 3.5–5.2)
Alkaline Phosphatase: 79 U/L (ref 39–117)
BUN: 20 mg/dL (ref 6–23)
CO2: 27 mEq/L (ref 19–32)
Calcium: 9.8 mg/dL (ref 8.4–10.5)
Chloride: 104 mEq/L (ref 96–112)
Creatinine, Ser: 1.53 mg/dL — ABNORMAL HIGH (ref 0.40–1.50)
GFR: 50.4 mL/min — ABNORMAL LOW (ref >60.00)
Glucose, Bld: 89 mg/dL (ref 70–99)
Potassium: 4 mEq/L (ref 3.5–5.1)
Sodium: 140 mEq/L (ref 135–145)
Total Bilirubin: 0.4 mg/dL (ref 0.2–1.2)
Total Protein: 7.7 g/dL (ref 6.0–8.3)

## 2023-02-08 LAB — CBC
HCT: 41.7 % (ref 39.0–52.0)
Hemoglobin: 13.2 g/dL (ref 13.0–17.0)
MCHC: 31.7 g/dL (ref 30.0–36.0)
MCV: 86.1 fl (ref 78.0–100.0)
Platelets: 186 10*3/uL (ref 150.0–400.0)
RBC: 4.84 Mil/uL (ref 4.22–5.81)
RDW: 12.9 % (ref 11.5–15.5)
WBC: 4.5 10*3/uL (ref 4.0–10.5)

## 2023-02-08 LAB — LIPID PANEL
Cholesterol: 206 mg/dL — ABNORMAL HIGH (ref 0–200)
HDL: 34 mg/dL — ABNORMAL LOW (ref >39.00)
NonHDL: 172.05
Total CHOL/HDL Ratio: 6
Triglycerides: 255 mg/dL — ABNORMAL HIGH (ref 0.0–149.0)
VLDL: 51 mg/dL — ABNORMAL HIGH (ref 0.0–40.0)

## 2023-02-08 LAB — TSH: TSH: 1.13 u[IU]/mL (ref 0.35–5.50)

## 2023-02-08 LAB — LDL CHOLESTEROL, DIRECT: Direct LDL: 135 mg/dL

## 2023-02-08 LAB — PSA: PSA: 2.01 ng/mL (ref 0.10–4.00)

## 2023-02-08 MED ORDER — LISINOPRIL 40 MG PO TABS
40.0000 mg | ORAL_TABLET | Freq: Every day | ORAL | 3 refills | Status: DC
Start: 2023-02-08 — End: 2023-12-21

## 2023-02-08 MED ORDER — DICLOFENAC SODIUM 1 % EX GEL
2.0000 g | Freq: Four times a day (QID) | CUTANEOUS | 3 refills | Status: AC
Start: 2023-02-08 — End: ?

## 2023-02-08 MED ORDER — AMLODIPINE BESYLATE 10 MG PO TABS
10.0000 mg | ORAL_TABLET | Freq: Every day | ORAL | 3 refills | Status: DC
Start: 2023-02-08 — End: 2024-01-25

## 2023-02-08 NOTE — Progress Notes (Signed)
Subjective:    Patient ID: Jerry Kaiser, male    DOB: Aug 17, 1965, 57 y.o.   MRN: [id number]  HPI Patient presents for yearly preventative medicine examination. He is a pleasant 57 year old male who  has a past medical history of Hypertension.  Hypertension -maintained on Norvasc 10 mg and lisinopril 40 mg daily.  He does not check his blood pressures at home but denies dizziness, lightheadedness, chest pain, or shortness of breath. His wife does check his blood pressure at home and reports readings in the 120-130's/80 BP Readings from Last 3 Encounters:  02/08/23 (!) 120/90  11/23/22 130/88  02/04/22 130/84   Right knee pain - he had a steroid injection two months ago, this has worked well for him in the past but today he reports that the pain did subside for a short period of time but then the pain came back and can be " unbearable at times". He did have an xray of the right knee back in 2022 which showed:  Mild patellofemoral joint space compartment degenerative changes. Small joint effusion.  His pain is located along the inside of his knee. Pain is worse with movements and change in positions.   All immunizations and health maintenance protocols were reviewed with the patient and needed orders were placed.  Appropriate screening laboratory values were ordered for the patient including screening of hyperlipidemia, renal function and hepatic function. If indicated by BPH, a PSA was ordered.  Medication reconciliation,  past medical history, social history, problem list and allergies were reviewed in detail with the patient  Goals were established with regard to weight loss, exercise, and  diet in compliance with medications  Wt Readings from Last 3 Encounters:  02/08/23 254 lb (115.2 kg)  11/23/22 253 lb (114.8 kg)  02/04/22 252 lb (114.3 kg)    He is up to date on routine colon cancer screening    Review of Systems  Constitutional: Negative.   HENT: Negative.     Eyes: Negative.   Respiratory: Negative.    Cardiovascular: Negative.   Gastrointestinal: Negative.   Endocrine: Negative.   Genitourinary: Negative.   Musculoskeletal:  Positive for arthralgias.  Skin: Negative.   Allergic/Immunologic: Negative.   Neurological: Negative.   Hematological: Negative.   Psychiatric/Behavioral: Negative.    All other systems reviewed and are negative.  Past Medical History:  Diagnosis Date   Hypertension     Social History   Socioeconomic History   Marital status: Single    Spouse name: Not on file   Number of children: Not on file   Years of education: Not on file   Highest education level: Not on file  Occupational History   Not on file  Tobacco Use   Smoking status: Former   Smokeless tobacco: Never  Vaping Use   Vaping status: Never Used  Substance and Sexual Activity   Alcohol use: No    Alcohol/week: 0.0 standard drinks of alcohol   Drug use: No   Sexual activity: Yes    Birth control/protection: None  Other Topics Concern   Not on file  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, 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   Social Determinants of Health   Financial Resource Strain: Not on file  Food Insecurity: Not on file  Transportation Needs: Not on file  Physical Activity: Not on file  Stress: Not on file  Social Connections: Unknown (11/03/2021)  [facility]   Social Network    Social Network: Not on file  Intimate Partner Violence: Unknown (09/25/2021)   Received from [facility]   HITS    Physically Hurt: Not on file    Insult or Talk Down To: Not on file    Threaten Physical Harm: Not on file    Scream or Curse: Not on file    Past Surgical History:  Procedure Laterality Date   APPENDECTOMY     CATARACT EXTRACTION     left   LIPOMA EXCISION     neck    Family History  Problem Relation Age of Onset   Heart failure Mother     No Known Allergies  Current Outpatient Medications  on File Prior to Visit  Medication Sig Dispense Refill   amLODipine (NORVASC) 10 MG tablet TAKE 1 TABLET (10 MG TOTAL) BY MOUTH DAILY. SCHEDULE PHYSICAL FOR FUTURE REFILLS 30 tablet 11   lisinopril (ZESTRIL) 40 MG tablet Take 1 tablet (40 mg total) by mouth daily. 90 tablet 3   No current facility-administered medications on file prior to visit.    BP (!) 120/90   Pulse 90   Temp 98.1 F (36.7 C) (Oral)   Ht 5\' 11"  (1.803 m)   Wt 254 lb (115.2 kg)   SpO2 97%   BMI 35.43 kg/m       Objective:   Physical Exam Vitals and nursing note reviewed.  Constitutional:      General: He is not in acute distress.    Appearance: Normal appearance. He is not ill-appearing.  HENT:     Head: Normocephalic and atraumatic.     Right Ear: Tympanic membrane, ear canal and external ear normal. There is no impacted cerumen.     Left Ear: Tympanic membrane, ear canal and external ear normal. There is no impacted cerumen.     Nose: Nose normal. No congestion or rhinorrhea.     Mouth/Throat:     Mouth: Mucous membranes are moist.     Pharynx: Oropharynx is clear.  Eyes:     Extraocular Movements: Extraocular movements intact.     Conjunctiva/sclera: Conjunctivae normal.     Pupils: Pupils are equal, round, and reactive to light.  Neck:     Vascular: No carotid bruit.  Cardiovascular:     Rate and Rhythm: Normal rate and regular rhythm.     Pulses: Normal pulses.     Heart sounds: No murmur heard.    No friction rub. No gallop.  Pulmonary:     Effort: Pulmonary effort is normal.     Breath sounds: Normal breath sounds.  Abdominal:     General: Abdomen is flat. Bowel sounds are normal. There is no distension.     Palpations: Abdomen is soft. There is no mass.     Tenderness: There is no abdominal tenderness. There is no guarding or rebound.     Hernia: No hernia is present.  Musculoskeletal:        General: Normal range of motion.     Cervical back: Normal range of motion and neck supple.      Left knee: No swelling, deformity, erythema, bony tenderness or crepitus. Normal range of motion. Tenderness present over the MCL. No LCL, ACL, PCL or patellar tendon tenderness. No LCL laxity, MCL laxity, ACL laxity or PCL laxity.Normal alignment, normal meniscus and normal patellar mobility. Normal pulse.     Instability Tests: Anterior drawer test negative. Posterior drawer test  negative. Medial McMurray test negative and lateral McMurray test negative.  Lymphadenopathy:     Cervical: No cervical adenopathy.  Skin:    General: Skin is warm and dry.     Capillary Refill: Capillary refill takes less than 2 seconds.  Neurological:     General: No focal deficit present.     Mental Status: He is alert and oriented to person, place, and time.  Psychiatric:        Mood and Affect: Mood normal.        Behavior: Behavior normal.        Thought Content: Thought content normal.        Judgment: Judgment normal.       Assessment & Plan:   1. Routine general medical examination at a health care facility Today patient counseled on age appropriate routine health concerns for screening and prevention, each reviewed and up to date or declined. Immunizations reviewed and up to date or declined. Labs ordered and reviewed. Risk factors for depression reviewed and negative. Hearing function and visual acuity are intact. ADLs screened and addressed as needed. Functional ability and level of safety reviewed and appropriate. Education, counseling and referrals performed based on assessed risks today. Patient provided with a copy of personalized plan for preventive services. - Stay active and eat healthy   2. Essential hypertension - At goal. No change in medication  - Lipid panel; Future - TSH; Future - CBC; Future - Comprehensive metabolic panel; Future - amLODipine (NORVASC) 10 MG tablet; Take 1 tablet (10 mg total) by mouth daily.  Dispense: 90 tablet; Refill: 3 - lisinopril (ZESTRIL) 40 MG tablet;  Take 1 tablet (40 mg total) by mouth daily.  Dispense: 90 tablet; Refill: 3  3. Prostate cancer screening  - PSA; Future  4. Acute pain of right knee - seems to be more MCL strain from over use - likely from job. Will check xray though  - Can use Voltaren Gel and advised to use knee brace - Can consider MRI  - diclofenac Sodium (VOLTAREN) 1 % GEL; Apply 2 g topically 4 (four) times daily.  Dispense: 150 g; Refill: 3 - DG Knee 1-2 Views Right; Future   Shirline Frees, NP

## 2023-02-10 ENCOUNTER — Telehealth: Payer: Self-pay | Admitting: Adult Health

## 2023-02-10 NOTE — Telephone Encounter (Signed)
Pt calling for results from 02/08/23

## 2023-02-11 ENCOUNTER — Other Ambulatory Visit: Payer: Self-pay

## 2023-02-11 MED ORDER — ATORVASTATIN CALCIUM 10 MG PO TABS: 10.0000 mg | ORAL_TABLET | Freq: Every day | ORAL | 3 refills | Status: DC

## 2023-02-11 NOTE — Telephone Encounter (Signed)
Patient notified of update  and verbalized understanding. 

## 2023-02-14 DIAGNOSIS — M2241 Chondromalacia patellae, right knee: Secondary | ICD-10-CM | POA: Diagnosis not present

## 2023-03-07 DIAGNOSIS — M7051 Other bursitis of knee, right knee: Secondary | ICD-10-CM | POA: Diagnosis not present

## 2023-03-09 ENCOUNTER — Telehealth: Payer: Self-pay | Admitting: Adult Health

## 2023-03-09 NOTE — Telephone Encounter (Signed)
Pt requesting a call regarding getting an MRI scheduled, says it was discussed at his last appointment

## 2023-03-10 NOTE — Telephone Encounter (Signed)
Tried to call pt no answer. Not sure what MRI he is referring to.

## 2023-03-15 NOTE — Telephone Encounter (Signed)
Pt wanted MRI done and needing the order placed due to leg pain.

## 2023-03-16 ENCOUNTER — Other Ambulatory Visit: Payer: Self-pay | Admitting: Adult Health

## 2023-03-16 DIAGNOSIS — M25561 Pain in right knee: Secondary | ICD-10-CM

## 2023-03-16 NOTE — Telephone Encounter (Signed)
Noted  

## 2023-03-19 DIAGNOSIS — M25561 Pain in right knee: Secondary | ICD-10-CM | POA: Diagnosis not present

## 2023-04-28 DIAGNOSIS — M2341 Loose body in knee, right knee: Secondary | ICD-10-CM | POA: Diagnosis not present

## 2023-04-28 DIAGNOSIS — S83241A Other tear of medial meniscus, current injury, right knee, initial encounter: Secondary | ICD-10-CM | POA: Diagnosis not present

## 2023-04-28 DIAGNOSIS — X58XXXA Exposure to other specified factors, initial encounter: Secondary | ICD-10-CM | POA: Diagnosis not present

## 2023-04-28 DIAGNOSIS — S83281A Other tear of lateral meniscus, current injury, right knee, initial encounter: Secondary | ICD-10-CM | POA: Diagnosis not present

## 2023-04-28 DIAGNOSIS — G8918 Other acute postprocedural pain: Secondary | ICD-10-CM | POA: Diagnosis not present

## 2023-04-28 DIAGNOSIS — Y999 Unspecified external cause status: Secondary | ICD-10-CM | POA: Diagnosis not present

## 2023-05-02 DIAGNOSIS — S83231D Complex tear of medial meniscus, current injury, right knee, subsequent encounter: Secondary | ICD-10-CM | POA: Diagnosis not present

## 2023-05-06 DIAGNOSIS — S83231D Complex tear of medial meniscus, current injury, right knee, subsequent encounter: Secondary | ICD-10-CM | POA: Diagnosis not present

## 2023-05-17 ENCOUNTER — Ambulatory Visit: Payer: 59 | Admitting: Adult Health

## 2023-05-17 NOTE — Progress Notes (Deleted)
Subjective:    Patient ID: Jerry Kaiser, male    DOB: 04-08-1966, 57 y.o.   MRN: [id number]  HPI  57 year old male who  has a past medical history of Hypertension.  He is being evaluated today for 17-month follow-up regarding hyperlipidemia.  During his last visit we started him on Lipitor 10 mg.  He does report that he has been tolerating this medication well with no side effects such as myalgia or fatigue.  Lab Results  Component Value Date   CHOL 206 (H) 02/08/2023   HDL 34.00 (L) 02/08/2023   LDLCALC 103 (H) 02/04/2022   LDLDIRECT 135.0 02/08/2023   TRIG 255.0 (H) 02/08/2023   CHOLHDL 6 02/08/2023     Review of Systems See HPI   Past Medical History:  Diagnosis Date   Hypertension     Social History   Socioeconomic History   Marital status: Single    Spouse name: Not on file   Number of children: Not on file   Years of education: Not on file   Highest education level: Not on file  Occupational History   Not on file  Tobacco Use   Smoking status: Former   Smokeless tobacco: Never  Vaping Use   Vaping status: Never Used  Substance and Sexual Activity   Alcohol use: No    Alcohol/week: 0.0 standard drinks of alcohol   Drug use: No   Sexual activity: Yes    Birth control/protection: None  Other Topics Concern   Not on file  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, 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   Social Determinants of Health   Financial Resource Strain: Not on file  Food Insecurity: Not on file  Transportation Needs: Not on file  Physical Activity: Not on file  Stress: Not on file  Social Connections: Unknown (11/03/2021)   Received from [facility], [facility]   Social Network    Social Network: Not on file  Intimate Partner Violence: Unknown (09/25/2021)   Received from [facility], [facility]   HITS    Physically Hurt: Not on file    Insult or Talk Down To: Not on file    Threaten Physical Harm: Not on file     Scream or Curse: Not on file    Past Surgical History:  Procedure Laterality Date   APPENDECTOMY     CATARACT EXTRACTION     left   LIPOMA EXCISION     neck    Family History  Problem Relation Age of Onset   Heart failure Mother     No Known Allergies  Current Outpatient Medications on File Prior to Visit  Medication Sig Dispense Refill   amLODipine (NORVASC) 10 MG tablet Take 1 tablet (10 mg total) by mouth daily. 90 tablet 3   atorvastatin (LIPITOR) 10 MG tablet Take 1 tablet (10 mg total) by mouth daily. 90 tablet 3   diclofenac Sodium (VOLTAREN) 1 % GEL Apply 2 g topically 4 (four) times daily. 150 g 3   lisinopril (ZESTRIL) 40 MG tablet Take 1 tablet (40 mg total) by mouth daily. 90 tablet 3   No current facility-administered medications on file prior to visit.    There were no vitals taken for this visit.      Objective:   Physical Exam Vitals and nursing note reviewed.  Constitutional:      Appearance: Normal appearance.  Cardiovascular:  Rate and Rhythm: Normal rate and regular rhythm.     Pulses: Normal pulses.     Heart sounds: Normal heart sounds.  Pulmonary:     Effort: Pulmonary effort is normal.     Breath sounds: Normal breath sounds.  Musculoskeletal:        General: Normal range of motion.  Skin:    General: Skin is warm and dry.  Neurological:     General: No focal deficit present.     Mental Status: He is alert and oriented to person, place, and time.  Psychiatric:        Mood and Affect: Mood normal.        Behavior: Behavior normal.        Thought Content: Thought content normal.        Judgment: Judgment normal.       Assessment & Plan:

## 2023-06-09 ENCOUNTER — Other Ambulatory Visit: Payer: Self-pay | Admitting: Adult Health

## 2023-06-09 ENCOUNTER — Ambulatory Visit (INDEPENDENT_AMBULATORY_CARE_PROVIDER_SITE_OTHER): Payer: 59 | Admitting: Adult Health

## 2023-06-09 VITALS — BP 140/90 | HR 82 | Temp 98.6°F | Ht 71.0 in

## 2023-06-09 DIAGNOSIS — R051 Acute cough: Secondary | ICD-10-CM | POA: Diagnosis not present

## 2023-06-09 DIAGNOSIS — U071 COVID-19: Secondary | ICD-10-CM

## 2023-06-09 DIAGNOSIS — R6889 Other general symptoms and signs: Secondary | ICD-10-CM

## 2023-06-09 LAB — POC COVID19 BINAXNOW: SARS Coronavirus 2 Ag: POSITIVE — AB

## 2023-06-09 LAB — POCT RAPID STREP A (OFFICE): Rapid Strep A Screen: NEGATIVE

## 2023-06-09 MED ORDER — MOLNUPIRAVIR EUA 200MG CAPSULE: 4.0000 | ORAL_CAPSULE | Freq: Two times a day (BID) | ORAL | 0 refills | Status: DC

## 2023-06-09 MED ORDER — HYDROCODONE BIT-HOMATROP MBR 5-1.5 MG/5ML PO SOLN: 5.0000 mL | Freq: Three times a day (TID) | ORAL | 0 refills | Status: DC | PRN

## 2023-06-09 NOTE — Progress Notes (Signed)
Subjective:    Patient ID: Jerry Kaiser, male    DOB: 09-Mar-1966, 56 y.o.   MRN: [id number]  Sinusitis   57 year old male who  has a past medical history of Hypertension.  He is being evaluated today for an acute issue.  He reports that starting 2 days ago he developed a semi productive cough, nasal congestion, chills, body aches, headache, and a fever up to 102 degrees.  He denies shortness of breath, sore throat, dizziness, or ear pain.  He has been taking Tylenol every 6 hours for symptom relief.  He denies any sick contacts at home. Cough is keeping him up at night.    Review of Systems See HPI   Past Medical History:  Diagnosis Date   Hypertension     Social History   Socioeconomic History   Marital status: Single    Spouse name: Not on file   Number of children: Not on file   Years of education: Not on file   Highest education level: Not on file  Occupational History   Not on file  Tobacco Use   Smoking status: Former   Smokeless tobacco: Never  Vaping Use   Vaping status: Never Used  Substance and Sexual Activity   Alcohol use: No    Alcohol/week: 0.0 standard drinks of alcohol   Drug use: No   Sexual activity: Yes    Birth control/protection: None  Other Topics Concern   Not on file  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, 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   Social Drivers of Corporate investment banker Strain: Not on file  Food Insecurity: Not on file  Transportation Needs: Not on file  Physical Activity: Not on file  Stress: Not on file  Social Connections: Unknown (11/03/2021)   Received from [facility], [facility]   Social Network    Social Network: Not on file  Intimate Partner Violence: Unknown (09/25/2021)   Received from [facility], [facility]   HITS    Physically Hurt: Not on file    Insult or Talk Down To: Not on file    Threaten Physical Harm: Not on file    Scream or Curse: Not on file    Past Surgical  History:  Procedure Laterality Date   APPENDECTOMY     CATARACT EXTRACTION     left   LIPOMA EXCISION     neck    Family History  Problem Relation Age of Onset   Heart failure Mother     No Known Allergies  Current Outpatient Medications on File Prior to Visit  Medication Sig Dispense Refill   amLODipine (NORVASC) 10 MG tablet Take 1 tablet (10 mg total) by mouth daily. 90 tablet 3   aspirin 81 MG chewable tablet Chew 81 mg by mouth 2 (two) times daily.     atorvastatin (LIPITOR) 10 MG tablet Take 1 tablet (10 mg total) by mouth daily. 90 tablet 3   diclofenac Sodium (VOLTAREN) 1 % GEL Apply 2 g topically 4 (four) times daily. 150 g 3   HYDROcodone-acetaminophen (NORCO) 10-325 MG tablet Take 1 tablet by mouth every 6 (six) hours as needed.     lisinopril (ZESTRIL) 40 MG tablet Take 1 tablet (40 mg total) by mouth daily. 90 tablet 3   meloxicam (MOBIC) 15 MG tablet Take 15 mg by mouth daily as needed.     No current facility-administered  medications on file prior to visit.    BP (!) 140/90   Pulse 82   Temp 98.6 F (37 C) (Oral)   Ht 5\' 11"  (1.803 m)   SpO2 97%   BMI 35.43 kg/m       Objective:   Physical Exam Vitals and nursing note reviewed.  Constitutional:      Appearance: Normal appearance. He is ill-appearing.  Cardiovascular:     Rate and Rhythm: Normal rate and regular rhythm.     Heart sounds: Normal heart sounds.  Pulmonary:     Effort: Pulmonary effort is normal.     Breath sounds: Normal breath sounds.  Musculoskeletal:        General: Normal range of motion.  Skin:    General: Skin is dry.  Neurological:     General: No focal deficit present.     Mental Status: He is alert and oriented to person, place, and time.  Psychiatric:        Mood and Affect: Mood normal.        Behavior: Behavior normal.        Thought Content: Thought content normal.        Judgment: Judgment normal.       Assessment & Plan:  1. Flu-like symptoms (Primary)  -  POC Rapid Strep A- Negative - POC COVID-19 - Positive   2. COVID-19  - molnupiravir EUA (LAGEVRIO) 200 mg CAPS capsule; Take 4 capsules (800 mg total) by mouth 2 (two) times daily for 5 days.  Dispense: 40 capsule; Refill: 0  3. Acute cough - Advised not to take any Norco with this cough syrup.  - HYDROcodone bit-homatropine (HYCODAN) 5-1.5 MG/5ML syrup; Take 5 mLs by mouth every 8 (eight) hours as needed for cough.  Dispense: 120 mL; Refill: 0  Shirline Frees, NP

## 2023-06-09 NOTE — Patient Instructions (Signed)
It was great seeing you today   Your testing indicates you have Covid 19 - I have sent in an antiviral medication for you to take   Please let me know if symptoms worsen

## 2023-06-10 ENCOUNTER — Other Ambulatory Visit: Payer: Self-pay | Admitting: Adult Health

## 2023-06-10 MED ORDER — NIRMATRELVIR/RITONAVIR (PAXLOVID) TABLET (RENAL DOSING): 2.0000 | ORAL_TABLET | Freq: Two times a day (BID) | ORAL | 0 refills | Status: AC

## 2023-06-10 NOTE — Telephone Encounter (Signed)
Noted  

## 2023-07-05 ENCOUNTER — Encounter: Payer: Self-pay | Admitting: Adult Health

## 2023-07-05 ENCOUNTER — Ambulatory Visit (INDEPENDENT_AMBULATORY_CARE_PROVIDER_SITE_OTHER): Payer: Self-pay | Admitting: Adult Health

## 2023-07-05 VITALS — BP 138/88 | HR 79 | Temp 98.8°F | Wt 248.0 lb

## 2023-07-05 DIAGNOSIS — M25561 Pain in right knee: Secondary | ICD-10-CM | POA: Diagnosis not present

## 2023-07-05 DIAGNOSIS — E782 Mixed hyperlipidemia: Secondary | ICD-10-CM | POA: Diagnosis not present

## 2023-07-05 MED ORDER — MELOXICAM 15 MG PO TABS: 15.0000 mg | ORAL_TABLET | Freq: Every day | ORAL | 0 refills | Status: DC | PRN

## 2023-07-05 NOTE — Progress Notes (Signed)
 Subjective:    Patient ID: Jerry Kaiser, male    DOB: Jul 11, 1965, 58 y.o.   MRN: [id number]  Diabetes   58 year old male who  has a past medical history of Hypertension.  He presents to the office today for follow up regarding hyperlipidemia.. Three months ago he was started on lipitor 10 mg for elevated cholesterol panel. He has been taking this medication daily and has not had any side effects.   He had surgery on is right knee on 11/24 at [facility]  they cleaned it out. He is still having a slight pain and is wondering if I would refill his Mobic  for a short period of time.    Review of Systems See HPI   Past Medical History:  Diagnosis Date   Hypertension     Social History   Socioeconomic History   Marital status: Single    Spouse name: Not on file   Number of children: Not on file   Years of education: Not on file   Highest education level: Not on file  Occupational History   Not on file  Tobacco Use   Smoking status: Former   Smokeless tobacco: Never  Vaping Use   Vaping status: Never Used  Substance and Sexual Activity   Alcohol use: No    Alcohol/week: 0.0 standard drinks of alcohol   Drug use: No   Sexual activity: Yes    Birth control/protection: None  Other Topics Concern   Not on file  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, 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   Social Drivers of Corporate Investment Banker Strain: Not on file  Food Insecurity: Not on file  Transportation Needs: Not on file  Physical Activity: Not on file  Stress: Not on file  Social Connections: Unknown (11/03/2021)   Received from [facility], [facility]   Social Network    Social Network: Not on file  Intimate Partner Violence: Unknown (09/25/2021)   Received from [facility], [facility]   HITS    Physically Hurt: Not on file    Insult or Talk Down To: Not on file    Threaten Physical Harm: Not on file    Scream or Curse: Not on file     Past Surgical History:  Procedure Laterality Date   APPENDECTOMY     CATARACT EXTRACTION     left   LIPOMA EXCISION     neck    Family History  Problem Relation Age of Onset   Heart failure Mother     No Known Allergies  Current Outpatient Medications on File Prior to Visit  Medication Sig Dispense Refill   amLODipine  (NORVASC ) 10 MG tablet Take 1 tablet (10 mg total) by mouth daily. 90 tablet 3   aspirin 81 MG chewable tablet Chew 81 mg by mouth 2 (two) times daily.     atorvastatin  (LIPITOR) 10 MG tablet Take 1 tablet (10 mg total) by mouth daily. 90 tablet 3   diclofenac  Sodium (VOLTAREN ) 1 % GEL Apply 2 g topically 4 (four) times daily. 150 g 3   HYDROcodone  bit-homatropine (HYCODAN) 5-1.5 MG/5ML syrup Take 5 mLs by mouth every 8 (eight) hours as needed for cough. 120 mL 0   HYDROcodone -acetaminophen  (NORCO) 10-325 MG tablet Take 1 tablet by mouth every 6 (six) hours as needed.     lisinopril  (ZESTRIL ) 40 MG tablet Take 1 tablet (40 mg  total) by mouth daily. 90 tablet 3   meloxicam  (MOBIC ) 15 MG tablet Take 15 mg by mouth daily as needed.     No current facility-administered medications on file prior to visit.    BP 138/88   Pulse 79   Temp 98.8 F (37.1 C) (Oral)   Wt 248 lb (112.5 kg)   SpO2 97%   BMI 34.59 kg/m       Objective:   Physical Exam Vitals and nursing note reviewed.  Constitutional:      Appearance: Normal appearance.  Cardiovascular:     Rate and Rhythm: Normal rate and regular rhythm.     Pulses: Normal pulses.     Heart sounds: Normal heart sounds.  Pulmonary:     Effort: Pulmonary effort is normal.     Breath sounds: Normal breath sounds.  Musculoskeletal:        General: Normal range of motion.     Right knee: Normal.  Skin:    General: Skin is warm and dry.  Neurological:     General: No focal deficit present.     Mental Status: He is alert and oriented to person, place, and time.  Psychiatric:        Mood and Affect: Mood  normal.        Behavior: Behavior normal.        Thought Content: Thought content normal.        Judgment: Judgment normal.       Assessment & Plan:  1. Mixed hyperlipidemia (Primary) - Consider increase in statin  - Lipid panel; Future - Hepatic function panel; Future  2. Acute pain of right knee  - meloxicam  (MOBIC ) 15 MG tablet; Take 1 tablet (15 mg total) by mouth daily as needed.  Dispense: 30 tablet; Refill: 0   Time spent with patient today was 32 minutes which consisted of chart review, discussing hyperlipidemia and knee pain  work up, treatment answering questions and documentation.

## 2023-07-15 ENCOUNTER — Other Ambulatory Visit (INDEPENDENT_AMBULATORY_CARE_PROVIDER_SITE_OTHER): Payer: No Typology Code available for payment source

## 2023-07-15 DIAGNOSIS — E782 Mixed hyperlipidemia: Secondary | ICD-10-CM | POA: Diagnosis not present

## 2023-07-15 LAB — HEPATIC FUNCTION PANEL
ALT: 28 U/L (ref 0–53)
AST: 32 U/L (ref 0–37)
Albumin: 4.4 g/dL (ref 3.5–5.2)
Alkaline Phosphatase: 79 U/L (ref 39–117)
Bilirubin, Direct: 0.1 mg/dL (ref 0.0–0.3)
Total Bilirubin: 0.5 mg/dL (ref 0.2–1.2)
Total Protein: 7.5 g/dL (ref 6.0–8.3)

## 2023-07-15 LAB — LIPID PANEL
Cholesterol: 134 mg/dL (ref 0–200)
HDL: 42.7 mg/dL (ref >39.00)
LDL Cholesterol: 80 mg/dL (ref 0–99)
NonHDL: 91.23
Total CHOL/HDL Ratio: 3
Triglycerides: 58 mg/dL (ref 0.0–149.0)
VLDL: 11.6 mg/dL (ref 0.0–40.0)

## 2023-07-18 ENCOUNTER — Telehealth: Payer: Self-pay | Admitting: Adult Health

## 2023-07-18 ENCOUNTER — Telehealth: Payer: Self-pay

## 2023-07-18 NOTE — Telephone Encounter (Signed)
Copied from CRM [id number]. Topic: Clinical - Lab/Test Results >> Jul 18, 2023 12:43 PM Truddie Crumble wrote: Reason for CRM: Patient returning a call to the office for [facility]

## 2023-07-18 NOTE — Telephone Encounter (Signed)
Pt is returning [place] call concerning blood work result

## 2023-07-19 NOTE — Telephone Encounter (Signed)
Please see result note

## 2023-08-02 ENCOUNTER — Other Ambulatory Visit: Payer: Self-pay | Admitting: Adult Health

## 2023-08-02 DIAGNOSIS — M25561 Pain in right knee: Secondary | ICD-10-CM

## 2023-09-04 ENCOUNTER — Other Ambulatory Visit: Payer: Self-pay | Admitting: Adult Health

## 2023-09-04 DIAGNOSIS — M25561 Pain in right knee: Secondary | ICD-10-CM

## 2023-12-21 ENCOUNTER — Other Ambulatory Visit: Payer: Self-pay | Admitting: Adult Health

## 2023-12-21 DIAGNOSIS — I10 Essential (primary) hypertension: Secondary | ICD-10-CM

## 2023-12-28 ENCOUNTER — Other Ambulatory Visit: Payer: Self-pay | Admitting: Adult Health

## 2024-01-25 ENCOUNTER — Other Ambulatory Visit: Payer: Self-pay | Admitting: Adult Health

## 2024-01-25 DIAGNOSIS — I10 Essential (primary) hypertension: Secondary | ICD-10-CM

## 2024-01-25 MED ORDER — AMLODIPINE BESYLATE 10 MG PO TABS: 10.0000 mg | ORAL_TABLET | Freq: Every day | ORAL | 3 refills | Status: AC

## 2024-01-25 NOTE — Telephone Encounter (Signed)
 Copied from CRM #[id number]. Topic: Clinical - Medication Refill >> Jan 25, 2024  2:04 PM [place] E wrote: Medication: amLODipine  (NORVASC ) 10 MG tablet  Has the patient contacted their pharmacy? No (Agent: If no, request that the patient contact the pharmacy for the refill. If patient does not wish to contact the pharmacy document the reason why and proceed with request.) (Agent: If yes, when and what did the pharmacy advise?)  This is the patient's preferred pharmacy:  [id number] - [place], [place] - [address] AT [facility] [address] [place] [address] Phone: [phone] Fax: [phone]  Is this the correct pharmacy for this prescription? Yes If no, delete pharmacy and type the correct one.   Has the prescription been filled recently? No  Is the patient out of the medication? Yes  Has the patient been seen for an appointment in the last year OR does the patient have an upcoming appointment? Yes  Can we respond through MyChart? No  Agent: Please be advised that Rx refills may take up to 3 business days. We ask that you follow-up with your pharmacy.
